# Patient Record
Sex: Male | Born: 1950 | Race: Asian | Hispanic: No | Marital: Married | Smoking: Never smoker
Health system: Southern US, Community
[De-identification: ages and names within clinical notes are randomized; demographics above are authoritative.]

## PROBLEM LIST (undated history)

## (undated) ENCOUNTER — Ambulatory Visit (HOSPITAL_BASED_OUTPATIENT_CLINIC_OR_DEPARTMENT_OTHER): Payer: Self-pay | Admitting: Gastroenterology

## (undated) DIAGNOSIS — G20A1 Parkinson's disease without dyskinesia, without mention of fluctuations: Secondary | ICD-10-CM

## (undated) DIAGNOSIS — G20 Parkinson's disease: Secondary | ICD-10-CM

## (undated) DIAGNOSIS — E785 Hyperlipidemia, unspecified: Secondary | ICD-10-CM

## (undated) DIAGNOSIS — E119 Type 2 diabetes mellitus without complications: Secondary | ICD-10-CM

## (undated) DIAGNOSIS — I10 Essential (primary) hypertension: Secondary | ICD-10-CM

## (undated) DIAGNOSIS — Z0000 Encounter for general adult medical examination without abnormal findings: Secondary | ICD-10-CM

## (undated) DIAGNOSIS — Z86010 Personal history of colonic polyps: Secondary | ICD-10-CM

## (undated) DIAGNOSIS — M545 Low back pain: Secondary | ICD-10-CM

## (undated) DIAGNOSIS — I639 Cerebral infarction, unspecified: Secondary | ICD-10-CM

## (undated) DIAGNOSIS — R251 Tremor, unspecified: Secondary | ICD-10-CM

## (undated) HISTORY — DX: Essential (primary) hypertension: I10

## (undated) HISTORY — PX: COLONOSCOPY: SHX174

## (undated) HISTORY — DX: Hyperlipidemia, unspecified: E78.5

## (undated) HISTORY — DX: Low back pain: M54.5

## (undated) HISTORY — DX: Tremor, unspecified: R25.1

## (undated) HISTORY — DX: Type 2 diabetes mellitus without complications: E11.9

## (undated) HISTORY — DX: Cerebral infarction, unspecified: I63.9

## (undated) HISTORY — DX: Personal history of colonic polyps: Z86.010

## (undated) HISTORY — DX: Encounter for general adult medical examination without abnormal findings: Z00.00

## (undated) HISTORY — DX: Parkinson's disease: G20

---

## 1978-04-18 HISTORY — PX: VASECTOMY: SHX75

## 1996-04-18 DIAGNOSIS — I639 Cerebral infarction, unspecified: Secondary | ICD-10-CM

## 1996-04-18 HISTORY — DX: Cerebral infarction, unspecified: I63.9

## 2004-06-22 ENCOUNTER — Ambulatory Visit: Admission: RE | Admit: 2004-06-22 | Payer: Self-pay | Source: Ambulatory Visit | Admitting: Gastroenterology

## 2005-02-14 ENCOUNTER — Ambulatory Visit: Payer: Self-pay | Admitting: Internal Medicine

## 2005-03-15 ENCOUNTER — Ambulatory Visit: Payer: Self-pay | Admitting: Internal Medicine

## 2005-06-28 ENCOUNTER — Ambulatory Visit: Payer: Self-pay | Admitting: Internal Medicine

## 2005-07-05 ENCOUNTER — Ambulatory Visit: Payer: Self-pay | Admitting: Internal Medicine

## 2005-09-13 ENCOUNTER — Ambulatory Visit: Payer: Self-pay | Admitting: Internal Medicine

## 2005-10-13 ENCOUNTER — Ambulatory Visit: Payer: Self-pay | Admitting: Internal Medicine

## 2006-03-02 ENCOUNTER — Ambulatory Visit: Payer: Self-pay | Admitting: Internal Medicine

## 2006-07-13 ENCOUNTER — Ambulatory Visit: Payer: Self-pay | Admitting: Internal Medicine

## 2006-07-13 LAB — CONVERTED CEMR LAB
Cholesterol: 175 mg/dL (ref 0–200)
Direct LDL: 96.9 mg/dL
GFR calc Af Amer: 113 mL/min
GFR calc non Af Amer: 93 mL/min
Glucose, Bld: 160 mg/dL — ABNORMAL HIGH (ref 70–99)
Sodium: 140 meq/L (ref 135–145)
Total CHOL/HDL Ratio: 4.1
Triglycerides: 238 mg/dL (ref 0–149)
VLDL: 48 mg/dL — ABNORMAL HIGH (ref 0–40)

## 2006-07-27 ENCOUNTER — Ambulatory Visit: Payer: Self-pay | Admitting: Internal Medicine

## 2006-11-24 ENCOUNTER — Ambulatory Visit: Payer: Self-pay | Admitting: Internal Medicine

## 2006-11-24 LAB — CONVERTED CEMR LAB
ALT: 43 units/L (ref 0–53)
Bilirubin, Direct: 0.1 mg/dL (ref 0.0–0.3)
CO2: 28 meq/L (ref 19–32)
Calcium: 9.3 mg/dL (ref 8.4–10.5)
Cholesterol: 163 mg/dL (ref 0–200)
GFR calc Af Amer: 100 mL/min
Glucose, Bld: 140 mg/dL — ABNORMAL HIGH (ref 70–99)
HDL: 44.4 mg/dL (ref >39.0)
Potassium: 4.4 meq/L (ref 3.5–5.1)
Sodium: 133 meq/L — ABNORMAL LOW (ref 135–145)
Total CHOL/HDL Ratio: 3.7
Total Protein: 7.1 g/dL (ref 6.0–8.3)
Triglycerides: 79 mg/dL (ref 0–149)

## 2007-01-05 ENCOUNTER — Ambulatory Visit: Payer: Self-pay | Admitting: Internal Medicine

## 2007-02-09 ENCOUNTER — Ambulatory Visit: Payer: Self-pay | Admitting: Internal Medicine

## 2007-04-10 DIAGNOSIS — I6350 Cerebral infarction due to unspecified occlusion or stenosis of unspecified cerebral artery: Secondary | ICD-10-CM | POA: Insufficient documentation

## 2007-04-10 DIAGNOSIS — E1165 Type 2 diabetes mellitus with hyperglycemia: Secondary | ICD-10-CM

## 2007-04-10 DIAGNOSIS — I10 Essential (primary) hypertension: Secondary | ICD-10-CM

## 2007-06-07 ENCOUNTER — Ambulatory Visit: Payer: Self-pay | Admitting: Internal Medicine

## 2007-06-07 DIAGNOSIS — R740 Nonspecific elevation of levels of transaminase and lactic acid dehydrogenase [LDH]: Secondary | ICD-10-CM

## 2007-06-07 LAB — CONVERTED CEMR LAB
AST: 29 units/L (ref 0–37)
HDL: 40.1 mg/dL (ref >39.0)
PSA: 0.43 ng/mL (ref 0.10–4.00)
VLDL: 20 mg/dL (ref 0–40)

## 2007-06-12 ENCOUNTER — Encounter: Payer: Self-pay | Admitting: Internal Medicine

## 2007-07-03 ENCOUNTER — Ambulatory Visit: Payer: Self-pay | Admitting: Internal Medicine

## 2007-07-03 DIAGNOSIS — I8390 Asymptomatic varicose veins of unspecified lower extremity: Secondary | ICD-10-CM | POA: Insufficient documentation

## 2007-07-03 DIAGNOSIS — E785 Hyperlipidemia, unspecified: Secondary | ICD-10-CM

## 2007-07-04 ENCOUNTER — Encounter (INDEPENDENT_AMBULATORY_CARE_PROVIDER_SITE_OTHER): Payer: Self-pay | Admitting: *Deleted

## 2007-07-20 ENCOUNTER — Encounter: Payer: Self-pay | Admitting: Internal Medicine

## 2007-08-08 ENCOUNTER — Ambulatory Visit: Payer: Self-pay | Admitting: Vascular Surgery

## 2007-11-02 ENCOUNTER — Ambulatory Visit: Payer: Self-pay | Admitting: Vascular Surgery

## 2007-11-22 ENCOUNTER — Ambulatory Visit: Payer: Self-pay | Admitting: Internal Medicine

## 2007-11-22 LAB — CONVERTED CEMR LAB
ALT: 27 units/L (ref 0–53)
CO2: 31 meq/L (ref 19–32)
Chloride: 102 meq/L (ref 96–112)
Creatinine, Ser: 1 mg/dL (ref 0.4–1.5)
Creatinine,U: 147.8 mg/dL
Hgb A1c MFr Bld: 7.1 % — ABNORMAL HIGH (ref 4.6–6.0)
Microalb Creat Ratio: 3.4 mg/g (ref 0.0–30.0)
Microalb, Ur: 0.5 mg/dL (ref 0.0–1.9)
Sodium: 139 meq/L (ref 135–145)
Total CHOL/HDL Ratio: 4
Triglycerides: 101 mg/dL (ref 0–149)

## 2007-11-29 ENCOUNTER — Ambulatory Visit: Payer: Self-pay | Admitting: Internal Medicine

## 2008-01-02 ENCOUNTER — Ambulatory Visit: Payer: Self-pay | Admitting: Vascular Surgery

## 2008-01-09 ENCOUNTER — Ambulatory Visit: Payer: Self-pay | Admitting: Vascular Surgery

## 2008-02-22 ENCOUNTER — Ambulatory Visit: Payer: Self-pay | Admitting: Vascular Surgery

## 2008-06-04 ENCOUNTER — Ambulatory Visit: Payer: Self-pay | Admitting: Internal Medicine

## 2008-06-04 LAB — CONVERTED CEMR LAB
CO2: 29 meq/L (ref 19–32)
Chloride: 99 meq/L (ref 96–112)
Creatinine,U: 53.9 mg/dL
Glucose, Bld: 132 mg/dL — ABNORMAL HIGH (ref 70–99)
Hgb A1c MFr Bld: 9 % — ABNORMAL HIGH (ref 4.6–6.0)
Microalb Creat Ratio: 3.7 mg/g (ref 0.0–30.0)
Microalb, Ur: 0.2 mg/dL (ref 0.0–1.9)
Potassium: 4.1 meq/L (ref 3.5–5.1)
Sodium: 137 meq/L (ref 135–145)

## 2008-06-16 ENCOUNTER — Ambulatory Visit: Payer: Self-pay | Admitting: Internal Medicine

## 2008-07-16 ENCOUNTER — Telehealth: Payer: Self-pay | Admitting: Internal Medicine

## 2008-09-05 ENCOUNTER — Ambulatory Visit: Payer: Self-pay | Admitting: Internal Medicine

## 2008-09-05 LAB — CONVERTED CEMR LAB: Hgb A1c MFr Bld: 7 % — ABNORMAL HIGH (ref 4.6–6.5)

## 2008-09-16 ENCOUNTER — Ambulatory Visit: Payer: Self-pay | Admitting: Internal Medicine

## 2009-01-08 ENCOUNTER — Ambulatory Visit: Payer: Self-pay | Admitting: Internal Medicine

## 2009-01-08 LAB — CONVERTED CEMR LAB
AST: 29 units/L (ref 0–37)
BUN: 23 mg/dL (ref 6–23)
Bilirubin, Direct: 0.2 mg/dL (ref 0.0–0.3)
CO2: 23 meq/L (ref 19–32)
Calcium: 9.5 mg/dL (ref 8.4–10.5)
Creatinine, Ser: 1.12 mg/dL (ref 0.40–1.50)
Glucose, Bld: 122 mg/dL — ABNORMAL HIGH (ref 70–99)
Hgb A1c MFr Bld: 6.4 % — ABNORMAL HIGH (ref 4.6–6.1)
Indirect Bilirubin: 0.5 mg/dL (ref 0.0–0.9)
Microalb, Ur: 0.98 mg/dL (ref 0.00–1.89)
Sodium: 141 meq/L (ref 135–145)
Total Bilirubin: 0.7 mg/dL (ref 0.3–1.2)
Total CHOL/HDL Ratio: 3

## 2009-01-19 ENCOUNTER — Ambulatory Visit: Payer: Self-pay | Admitting: Internal Medicine

## 2009-05-01 ENCOUNTER — Ambulatory Visit: Payer: Self-pay | Admitting: Internal Medicine

## 2009-05-01 LAB — CONVERTED CEMR LAB
CO2: 25 meq/L (ref 19–32)
Chloride: 101 meq/L (ref 96–112)
Creatinine, Ser: 1.04 mg/dL (ref 0.40–1.50)
Glucose, Bld: 102 mg/dL — ABNORMAL HIGH (ref 70–99)
Sodium: 139 meq/L (ref 135–145)

## 2009-05-11 ENCOUNTER — Ambulatory Visit: Payer: Self-pay | Admitting: Internal Medicine

## 2009-08-20 ENCOUNTER — Telehealth: Payer: Self-pay | Admitting: Internal Medicine

## 2009-10-15 ENCOUNTER — Encounter: Payer: Self-pay | Admitting: Internal Medicine

## 2009-10-15 LAB — CONVERTED CEMR LAB
ALT: 23 units/L (ref 0–53)
AST: 26 units/L (ref 0–37)
Albumin: 4.4 g/dL (ref 3.5–5.2)
Alkaline Phosphatase: 47 units/L (ref 39–117)
BUN: 19 mg/dL (ref 6–23)
Bilirubin, Direct: 0.1 mg/dL (ref 0.0–0.3)
CO2: 25 meq/L (ref 19–32)
Calcium: 9.5 mg/dL (ref 8.4–10.5)
Chloride: 104 meq/L (ref 96–112)
Cholesterol: 156 mg/dL (ref 0–200)
Creatinine, Ser: 0.94 mg/dL (ref 0.40–1.50)
Creatinine, Urine: 106.1 mg/dL
Glucose, Bld: 138 mg/dL — ABNORMAL HIGH (ref 70–99)
HDL: 42 mg/dL (ref >39)
Hgb A1c MFr Bld: 6.7 % — ABNORMAL HIGH (ref <5.7)
Indirect Bilirubin: 0.7 mg/dL (ref 0.0–0.9)
LDL Cholesterol: 85 mg/dL (ref 0–99)
Microalb Creat Ratio: 4.7 mg/g (ref 0.0–30.0)
Microalb, Ur: 0.5 mg/dL (ref 0.00–1.89)
Potassium: 4 meq/L (ref 3.5–5.3)
Sodium: 141 meq/L (ref 135–145)
Total Bilirubin: 0.8 mg/dL (ref 0.3–1.2)
Total CHOL/HDL Ratio: 3.7
Total Protein: 6.8 g/dL (ref 6.0–8.3)
Triglycerides: 147 mg/dL (ref <150)
VLDL: 29 mg/dL (ref 0–40)

## 2009-10-29 ENCOUNTER — Ambulatory Visit: Payer: Self-pay | Admitting: Internal Medicine

## 2009-11-18 ENCOUNTER — Telehealth: Payer: Self-pay | Admitting: Internal Medicine

## 2009-11-19 ENCOUNTER — Encounter: Payer: Self-pay | Admitting: Internal Medicine

## 2010-01-21 ENCOUNTER — Ambulatory Visit: Payer: Self-pay | Admitting: Internal Medicine

## 2010-04-21 ENCOUNTER — Encounter: Payer: Self-pay | Admitting: Internal Medicine

## 2010-04-21 LAB — CONVERTED CEMR LAB
BUN: 12 mg/dL
CO2: 27 meq/L
Calcium: 9.5 mg/dL
Chloride: 105 meq/L
Creatinine, Ser: 0.91 mg/dL
Glucose, Bld: 142 mg/dL — ABNORMAL HIGH
Hgb A1c MFr Bld: 7.7 % — ABNORMAL HIGH
Potassium: 4.4 meq/L
Sodium: 142 meq/L

## 2010-04-29 ENCOUNTER — Ambulatory Visit
Admission: RE | Admit: 2010-04-29 | Discharge: 2010-04-29 | Payer: Self-pay | Source: Home / Self Care | Attending: Internal Medicine | Admitting: Internal Medicine

## 2010-04-29 LAB — HM DIABETES FOOT EXAM

## 2010-05-18 NOTE — Miscellaneous (Signed)
Summary: Lab Orders  Clinical Lists Changes  Orders: Added new Test order of T-Basic Metabolic Panel [phone]) - Signed Added new Test order of T-Hepatic Function [phone]) - Signed Added new Test order of T-Lipid Profile [phone]) - Signed Added new Test order of T- Hemoglobin A1C ([id number]) - Signed Added new Test order of T-Urine Microalbumin w/creat. ratio [id number]) - Signed

## 2010-05-18 NOTE — Progress Notes (Signed)
  Phone Note Outgoing Call   Summary of Call: call pt - if > 1 yr since last DM eye exam,  needs referral. see order Initial call taken by: D. Thomos Lemons DO,  Aug 20, 2009 6:07 PM  Follow-up for Phone Call        Pt had last eye exam  @  [facility]   Dr Davonna Belling   June 29, 2009 Follow-up by: Darral Dash,  Aug 25, 2009 2:24 PM

## 2010-05-18 NOTE — Assessment & Plan Note (Signed)
Summary: 4 MONTH FOLLOW UP/MHF   Vital Signs:  Patient profile:   60 year old male Weight:      176.25 pounds BMI:     25.38 O2 Sat:      99 % on Room air Temp:     97.6 degrees F oral Pulse rate:   83 / minute Pulse rhythm:   regular Resp:     16 per minute BP sitting:   126 / 90  (left arm) Cuff size:   regular  Vitals Entered By: Glendell Docker CMA (May 11, 2009 3:12 PM)  O2 Flow:  Room air  Primary Care Provider:  D. Thomos Lemons DO  CC:  Follow up disease management and Type 2 diabetes mellitus follow-up.  History of Present Illness: Follow up disease management  Type 2 Diabetes Mellitus Follow-Up      This is a 59 year old man who presents for Type 2 diabetes mellitus follow-up.  The patient denies self managed hypoglycemia, hypoglycemia requiring help, and weight loss.  The patient denies the following symptoms: chest pain.  Since the last visit the patient reports good dietary compliance, compliance with medications, exercising regularly, and monitoring blood glucose.    Htn - stable   Preventive Screening-Counseling & Management  Alcohol-Tobacco     Smoking Status: quit  Allergies (verified): No Known Drug Allergies  Past History:  Past Medical History: DM  Type II Hyperlipidemia  Hypertension       Family History: Father recently died at age 36 - GI cancer  Brother recently died at age 52 - lung cancer    Social History: Married Alcohol use-no  Tobacco use -no    Smoking Status:  quit  Physical Exam  General:  alert, well-developed, and well-nourished.   Neck:  supple and no masses.   Lungs:  normal respiratory effort and normal breath sounds.   Heart:  normal rate, regular rhythm, and no gallop.    Diabetes Management Exam:    Foot Exam (with socks and/or shoes not present):       Inspection:          Left foot: normal          Right foot: normal   Impression & Recommendations:  Problem # 1:  DIABETES MELLITUS, TYPE II  (ICD-250.00) stable.  Maintain current medication regimen.  His updated medication list for this problem includes:    Lisinopril-hydrochlorothiazide 20-12.5 Mg Tabs (Lisinopril-hydrochlorothiazide) .Marland Kitchen... Take 1  tablet by mouth once a day    Metformin Hcl 1000 Mg Tabs (Metformin hcl) .Marland Kitchen... Take 1 tablet by mouth two times a day    Low-dose Aspirin 81 Mg Tabs (Aspirin) .Marland Kitchen... Take 1 tablet by mouth once a day    Actos 45 Mg Tabs (Pioglitazone hcl) .Marland Kitchen... 1/2 tablet by mouth once daily    Lantus Solostar 100 Unit/ml Soln (Insulin glargine) .Marland KitchenMarland KitchenMarland KitchenMarland Kitchen 10-20 units once in am  Labs Reviewed: Creat: 1.04 (05/01/2009)     Last Eye Exam: normal (01/28/2008) Reviewed HgBA1c results: 7.1 (05/01/2009)  6.4 (01/08/2009)  Problem # 2:  HYPERTENSION (ICD-401.9) stable.  Maintain current medication regimen.  His updated medication list for this problem includes:    Lisinopril-hydrochlorothiazide 20-12.5 Mg Tabs (Lisinopril-hydrochlorothiazide) .Marland Kitchen... Take 1  tablet by mouth once a day  BP today: 126/90 Prior BP: 110/80 (01/19/2009)  Labs Reviewed: K+: 4.3 (05/01/2009) Creat: : 1.04 (05/01/2009)   Chol: 157 (01/08/2009)   HDL: 52 (01/08/2009)   LDL: 86 (01/08/2009)   TG: 94 (  01/08/2009)  Complete Medication List: 1)  Lisinopril-hydrochlorothiazide 20-12.5 Mg Tabs (Lisinopril-hydrochlorothiazide) .... Take 1  tablet by mouth once a day 2)  Metformin Hcl 1000 Mg Tabs (Metformin hcl) .... Take 1 tablet by mouth two times a day 3)  Simvastatin 80 Mg Tabs (Simvastatin) .... 1/2 tablet by mouth at bedtime 4)  Onetouch Ultra Test Strp (Glucose blood) .... Test blood sugar two times a day 5)  Low-dose Aspirin 81 Mg Tabs (Aspirin) .... Take 1 tablet by mouth once a day 6)  Oscal 500/200 D-3 500-200 Mg-unit Tabs (Calcium-vitamin d) .... Take 1 tablet by mouth once a day 7)  Fish Oil 1000 Mg Caps (Omega-3 fatty acids) .... Take 1 tablet by mouth two times a day 8)  Multivitamins Tabs (Multiple vitamin) .... Take  1 tablet by mouth once a day 9)  Actos 45 Mg Tabs (Pioglitazone hcl) .... 1/2 tablet by mouth once daily 10)  Lantus Solostar 100 Unit/ml Soln (Insulin glargine) .Marland Kitchen.. 10-20 units once in am 11)  Pen Needles 31g X 8 Mm Misc (Insulin pen needle) .... For once daily use 12)  Triamcinolone Acetonide 0.1 % Crea (Triamcinolone acetonide) .... Use as directed  Patient Instructions: 1)  Please schedule a follow-up appointment in 6 months. 2)  BMP prior to visit, ICD-9:  401.9 3)  Hepatic Panel prior to visit, ICD-9: 272.4 4)  Lipid Panel prior to visit, ICD-9: 272.4 5)  HbgA1C prior to visit, ICD-9: 250.00 6)  Urine Microalbumin prior to visit, ICD-9: 250.00 7)  Please return for lab work one (1) week before your next appointment.       Current Allergies (reviewed today): No known allergies    Prevention & Chronic Care Immunizations   Influenza vaccine: Fluvax Non-MCR  (01/19/2009)    Tetanus booster: Not documented    Pneumococcal vaccine: Pneumovax  (11/29/2007)  Colorectal Screening   Hemoccult: Not documented    Colonoscopy: Not documented  Other Screening   PSA: 0.43  (06/07/2007)   Smoking status: quit  (05/11/2009)  Diabetes Mellitus   HgbA1C: 7.1  (05/01/2009)    Eye exam: normal  (01/28/2008)    Foot exam: yes  (05/11/2009)   High risk foot: Not documented   Foot care education: Not documented    Urine microalbumin/creatinine ratio: 5.0  (01/08/2009)  Lipids   Total Cholesterol: 157  (01/08/2009)   LDL: 86  (01/08/2009)   LDL Direct: 96.9  (07/13/2006)   HDL: 52  (01/08/2009)   Triglycerides: 94  (01/08/2009)    SGOT (AST): 29  (01/08/2009)   SGPT (ALT): 25  (01/08/2009)   Alkaline phosphatase: 55  (01/08/2009)   Total bilirubin: 0.7  (01/08/2009)  Hypertension   Last Blood Pressure: 126 / 90  (05/11/2009)   Serum creatinine: 1.04  (05/01/2009)   Serum potassium 4.3  (05/01/2009)  Self-Management Support :    Diabetes self-management support:  Not documented    Hypertension self-management support: Not documented    Lipid self-management support: Not documented

## 2010-05-18 NOTE — Miscellaneous (Signed)
Summary: Lab orders for January appt  Clinical Lists Changes  Orders: Added new Test order of T-Basic Metabolic Panel [phone]) - Signed Added new Test order of T- Hemoglobin A1C ([id number]) - Signed

## 2010-05-18 NOTE — Assessment & Plan Note (Signed)
Summary: FLU SHOT / TF,CMA  Nurse Visit   Allergies: No Known Drug Allergies  Immunizations Administered:  Influenza Vaccine # 1:    Vaccine Type: Fluvax 3+    Site: left deltoid    Mfr: Gl[employer]oSmith[employer]    Dose: 0.5 ml    Route: IM    Given by: Glendell Docker CMA    Exp. Date: 10/16/2010    Lot #: [id number]    VIS given: 11/10/09 version given January 21, 2010.  Flu Vaccine Consent Questions:    Do you have a history of severe allergic reactions to this vaccine? no    Any prior history of allergic reactions to egg and/or gelatin? no    Do you have a sensitivity to the preservative Thimersol? no    Do you have a past history of Guillan-Barre Syndrome? no    Do you currently have an acute febrile illness? no    Have you ever had a severe reaction to latex? no    Vaccine information given and explained to patient? yes  Orders Added: 1)  Flu Vaccine 15yrs + [90658] 2)  Admin 1st Vaccine [09811]

## 2010-05-18 NOTE — Assessment & Plan Note (Signed)
Summary: 6 month follow up/mhf rsc with pt from bump/mhf   Vital Signs:  Patient profile:   60 year old male Weight:      166.75 pounds BMI:     24.01 O2 Sat:      100 % on Room air Temp:     97.8 degrees F oral Pulse rate:   76 / minute Pulse rhythm:   regular Resp:     18 per minute BP sitting:   124 / 80  (right arm) Cuff size:   regular  Vitals Entered By: Glendell Docker CMA (October 29, 2009 3:24 PM)  O2 Flow:  Room air CC: 6 month Follow up disease management, Type 2 diabetes mellitus follow-up Is Patient Diabetic? Yes Did you bring your meter with you today? Yes Pain Assessment Patient in pain? no       Does patient need assistance? Functional Status Self care Ambulation Normal Comments low blood sugar 119 high 135 avg 125, stopped taking the Actos 2 months ago  due to increase in copay   Primary Care Provider:  D. Thomos Lemons DO  CC:  6 month Follow up disease management and Type 2 diabetes mellitus follow-up.  History of Present Illness:  Type 2 Diabetes Mellitus Follow-Up      This is a 60 year old man who presents for Type 2 diabetes mellitus follow-up.  The patient denies self managed hypoglycemia, hypoglycemia requiring help, and weight gain.  The patient denies the following symptoms: chest pain.  Since the last visit the patient reports good dietary compliance, exercising regularly, and monitoring blood glucose.  he stopped actos due to cost  Allergies (verified): No Known Drug Allergies  Past History:  Past Medical History: DM  Type II Hyperlipidemia   Hypertension       Family History: Father recently died at age 29 - GI cancer  Brother recently died at age 17 - lung cancer     Social History: Married works at [employer] Alcohol use-no   Tobacco use -no      Physical Exam  General:  alert, well-developed, and well-nourished.   Neck:  supple and no masses.  no carotid bruits.   Lungs:  normal respiratory effort and normal breath  sounds.   Heart:  normal rate, regular rhythm, and no gallop.   Extremities:  No lower extremity edema   Diabetes Management Exam:    Foot Exam (with socks and/or shoes not present):       Inspection:          Left foot: normal          Right foot: normal   Impression & Recommendations:  Problem # 1:  DIABETES MELLITUS, TYPE II (ICD-250.00)  He stopped actos due to cost.  No sign change in diabetes control.  The following medications were removed from the medication list:    Actos 45 Mg Tabs (Pioglitazone hcl) .Marland Kitchen... 1/2 tablet by mouth once daily His updated medication list for this problem includes:    Lisinopril-hydrochlorothiazide 20-12.5 Mg Tabs (Lisinopril-hydrochlorothiazide) .Marland Kitchen... Take 1  tablet by mouth once a day    Metformin Hcl 1000 Mg Tabs (Metformin hcl) .Marland Kitchen... Take 1 tablet by mouth two times a day    Low-dose Aspirin 81 Mg Tabs (Aspirin) .Marland Kitchen... Take 1 tablet by mouth once a day    Lantus Solostar 100 Unit/ml Soln (Insulin glargine) .Marland KitchenMarland KitchenMarland KitchenMarland Kitchen 10-20 units once in am  Labs Reviewed: Creat: 0.94 (10/15/2009)     Last  Eye Exam: normal (01/28/2008) Reviewed HgBA1c results: 6.7 (10/15/2009)  7.1 (05/01/2009)  Problem # 2:  HYPERTENSION (ICD-401.9) Assessment: Unchanged  His updated medication list for this problem includes:    Lisinopril-hydrochlorothiazide 20-12.5 Mg Tabs (Lisinopril-hydrochlorothiazide) .Marland Kitchen... Take 1  tablet by mouth once a day  BP today: 124/80 Prior BP: 126/90 (05/11/2009)  Labs Reviewed: K+: 4.0 (10/15/2009) Creat: : 0.94 (10/15/2009)   Chol: 156 (10/15/2009)   HDL: 42 (10/15/2009)   LDL: 85 (10/15/2009)   TG: 147 (10/15/2009)  Complete Medication List: 1)  Lisinopril-hydrochlorothiazide 20-12.5 Mg Tabs (Lisinopril-hydrochlorothiazide) .... Take 1  tablet by mouth once a day 2)  Metformin Hcl 1000 Mg Tabs (Metformin hcl) .... Take 1 tablet by mouth two times a day 3)  Simvastatin 80 Mg Tabs (Simvastatin) .... 1/2 tablet by mouth at bedtime 4)   Accu-chek Aviva Strp (Glucose blood) .... Use daily as directed 5)  Low-dose Aspirin 81 Mg Tabs (Aspirin) .... Take 1 tablet by mouth once a day 6)  Oscal 500/200 D-3 500-200 Mg-unit Tabs (Calcium-vitamin d) .... Take 1 tablet by mouth once a day 7)  Fish Oil 1000 Mg Caps (Omega-3 fatty acids) .... Take 1 tablet by mouth two times a day 8)  Multivitamins Tabs (Multiple vitamin) .... Take 1 tablet by mouth once a day 9)  Lantus Solostar 100 Unit/ml Soln (Insulin glargine) .Marland Kitchen.. 10-20 units once in am 10)  Pen Needles 31g X 8 Mm Misc (Insulin pen needle) .... For once daily use 11)  Triamcinolone Acetonide 0.1 % Crea (Triamcinolone acetonide) .... Use as directed  Patient Instructions: 1)  Please schedule a follow-up appointment in 6 months. 2)  BMP prior to visit, ICD-9: 401.9 3)  HbgA1C prior to visit, ICD-9: 250.00 4)  Please return for lab work one (1) week before your next appointment.  Prescriptions: ACCU-CHEK AVIVA  STRP (GLUCOSE BLOOD) use daily as directed  #100 x 3   Entered and Authorized by:   D. Thomos Lemons DO   Signed by:   D. Thomos Lemons DO on 10/29/2009   Method used:   Print then Give to Patient   RxID:   [id number]   Current Allergies (reviewed today): No known allergies

## 2010-05-18 NOTE — Progress Notes (Signed)
Summary: Colonoscopy  ---- Converted from flag ---- ---- 11/12/2009 5:18 PM, D. Thomos Lemons DO wrote: call pt - get copy of colonoscopy that was completed in [place] ------------------------------  Phone Note Outgoing Call   Call placed by: Glendell Docker CMA,  November 18, 2009 8:34 AM Call placed to: Patient Summary of Call: Call placed to patient at [phone], no answer. A voice message was left for patient to return call. Initial call taken by: Glendell Docker CMA,  November 18, 2009 8:34 AM  Follow-up for Phone Call        Patient returned phone call, and patient was  informed per Dr Artist Pais instructions. He states that he will look for his copy and if he is able to find it he will it in when his wife has her appointment with Dr Artist Pais on th 18th of August  Follow-up by: Glendell Docker CMA,  November 19, 2009 8:42 AM

## 2010-05-20 NOTE — Miscellaneous (Signed)
Summary: Orders Update  Clinical Lists Changes  Orders: Added new Test order of T-Basic Metabolic Panel ([id number]) - Signed Added new Test order of T- Hemoglobin A1C ([id number]) - Signed 

## 2010-05-20 NOTE — Assessment & Plan Note (Signed)
Summary: 6 month follow up/mhf   Vital Signs:  Patient profile:   60 year old male Height:      70 inches Weight:      173 pounds BMI:     24.91 O2 Sat:      98 % on Room air Temp:     97.9 degrees F oral Pulse rate:   75 / minute Resp:     18 per minute BP sitting:   118 / 80  (right arm) Cuff size:   large  Vitals Entered By: Glendell Docker CMA (April 29, 2010 2:56 PM)  O2 Flow:  Room air CC: 6 month follow up, Type 2 diabetes mellitus follow-up Is Patient Diabetic? Yes Did you bring your meter with you today? No Pain Assessment Patient in pain? no      Comments no cocerns,, checks blood suger weekly   Primary Care Provider:  Dondra Spry DO  CC:  6 month follow up and Type 2 diabetes mellitus follow-up.  History of Present Illness:  Type 2 Diabetes Mellitus Follow-Up      This is a 60 year old man who presents for Type 2 diabetes mellitus follow-up.  The patient denies polyuria, polydipsia, and hypoglycemia requiring help.  The patient denies the following symptoms: chest pain.  Since the last visit the patient reports poor dietary compliance, compliance with medications, not exercising regularly, and monitoring blood glucose.    visited daughter in [place] over the holidays   Preventive Screening-Counseling & Management  Alcohol-Tobacco     Smoking Status: quit  Allergies (verified): No Known Drug Allergies  Past History:  Past Medical History: DM  Type II Hyperlipidemia    Hypertension       Family History: Father recently died at age 18 - GI cancer  Brother recently died at age 16 - lung cancer      Social History: Married works at [employer] Alcohol use-no   Tobacco use -no       Physical Exam  General:  alert, well-developed, and well-nourished.   Neck:  supple and no masses.  no carotid bruits.   Lungs:  normal respiratory effort and normal breath sounds.   Heart:  normal rate, regular rhythm, and no gallop.   Extremities:  No  lower extremity edema   Diabetes Management Exam:    Foot Exam (with socks and/or shoes not present):       Inspection:          Left foot: normal          Right foot: normal   Impression & Recommendations:  Problem # 1:  DIABETES MELLITUS, TYPE II (ICD-250.00) Assessment Deteriorated less exercise and diet worse over holidays.  he defers start of short acting insulin Maintain current medication regimen.  His updated medication list for this problem includes:    Lisinopril-hydrochlorothiazide 20-12.5 Mg Tabs (Lisinopril-hydrochlorothiazide) .Marland Kitchen... Take 1  tablet by mouth once a day    Metformin Hcl 1000 Mg Tabs (Metformin hcl) .Marland Kitchen... Take 1 tablet by mouth two times a day    Low-dose Aspirin 81 Mg Tabs (Aspirin) .Marland Kitchen... Take 1 tablet by mouth once a day    Lantus Solostar 100 Unit/ml Soln (Insulin glargine) .Marland KitchenMarland KitchenMarland KitchenMarland Kitchen 30 units once in am  Labs Reviewed: Creat: 0.91 (04/21/2010)     Last Eye Exam: normal (01/28/2008) Reviewed HgBA1c results: 7.7 (04/21/2010)  6.7 (10/15/2009)  Problem # 2:  HYPERTENSION (ICD-401.9)  His updated medication list for this problem includes:  Lisinopril-hydrochlorothiazide 20-12.5 Mg Tabs (Lisinopril-hydrochlorothiazide) .Marland Kitchen... Take 1  tablet by mouth once a day  BP today: 118/80 Prior BP: 124/80 (10/29/2009)  Labs Reviewed: K+: 4.4 (04/21/2010) Creat: : 0.91 (04/21/2010)   Chol: 156 (10/15/2009)   HDL: 42 (10/15/2009)   LDL: 85 (10/15/2009)   TG: 147 (10/15/2009)  Complete Medication List: 1)  Lisinopril-hydrochlorothiazide 20-12.5 Mg Tabs (Lisinopril-hydrochlorothiazide) .... Take 1  tablet by mouth once a day 2)  Metformin Hcl 1000 Mg Tabs (Metformin hcl) .... Take 1 tablet by mouth two times a day 3)  Simvastatin 80 Mg Tabs (Simvastatin) .... 1/2 tablet by mouth at bedtime 4)  Accu-chek Aviva Strp (Glucose blood) .... Use daily as directed 5)  Low-dose Aspirin 81 Mg Tabs (Aspirin) .... Take 1 tablet by mouth once a day 6)  Oscal 500/200 D-3  500-200 Mg-unit Tabs (Calcium-vitamin d) .... Take 1 tablet by mouth once a day 7)  Fish Oil 1000 Mg Caps (Omega-3 fatty acids) .... Take 1 tablet by mouth two times a day 8)  Multivitamins Tabs (Multiple vitamin) .... Take 1 tablet by mouth once a day 9)  Lantus Solostar 100 Unit/ml Soln (Insulin glargine) .... 30 units once in am 10)  Pen Needles 31g X 8 Mm Misc (Insulin pen needle) .... For once daily use 11)  Triamcinolone Acetonide 0.1 % Crea (Triamcinolone acetonide) .... Use as directed  Patient Instructions: 1)  Please schedule a follow-up appointment in 6 months. 2)  BMP prior to visit, ICD-9: 401.9 3)  Hepatic Panel prior to visit, ICD-9: 272.4 4)  Lipid Panel prior to visit, ICD-9: 272.4 5)  HbgA1C prior to visit, ICD-9:  250.02 6)  Urine Microalbumin prior to visit, ICD-9:25.02 7)  Please return for lab work one (1) week before your next appointment.  Prescriptions: LANTUS SOLOSTAR 100 UNIT/ML SOLN (INSULIN GLARGINE) 30 units once in AM  #3 month x 3   Entered and Authorized by:   D. Thomos Lemons DO   Signed by:   D. Thomos Lemons DO on 04/29/2010   Method used:   Electronically to        [facility] [address] [place] (retail)       [phone]       [place], [place]  [address]       Ph: [phone]       Fax: [phone]   RxID:   [id number] PEN NEEDLES 31G X 8 MM MISC (INSULIN PEN NEEDLE) for once daily use  #100 x 3   Entered by:   Glendell Docker CMA   Authorized by:   D. Thomos Lemons DO   Signed by:   D. Thomos Lemons DO on 04/29/2010   Method used:   Electronically to        [facility] [address] [place] (retail)       [phone]       [place], [place]  [address]       Ph: [phone]       Fax: [phone]   RxID:   [id number] SIMVASTATIN 80 MG  TABS (SIMVASTATIN) 1/2 tablet by mouth at bedtime  #90 x 3   Entered by:   Glendell Docker CMA   Authorized by:   D. Thomos Lemons DO   Signed by:   D. Thomos Lemons DO on 04/29/2010   Method used:   Electronically to  [facility] [address] (retail)       [phone]       [place], [place]  [address]       Ph: [phone]       Fax: [phone]   RxID:   [id number] METFORMIN HCL 1000 MG TABS (METFORMIN HCL) Take 1 tablet by mouth two times a day  #180 Each x 4   Entered by:   Glendell Docker CMA   Authorized by:   D. Thomos Lemons DO   Signed by:   D. Thomos Lemons DO on 04/29/2010   Method used:   Electronically to        [facility] [address] [place] (retail)       [phone]       [place], [place]  [address]       Ph: [phone]       Fax: [phone]   RxID:   [id number] LISINOPRIL-HYDROCHLOROTHIAZIDE 20-12.5 MG TABS (LISINOPRIL-HYDROCHLOROTHIAZIDE) Take 1  tablet by mouth once a day  #90 Each x 0   Entered by:   Glendell Docker CMA   Authorized by:   D. Thomos Lemons DO   Signed by:   D. Thomos Lemons DO on 04/29/2010   Method used:   Electronically to        [facility] [address] [place] (retail)       [phone]       [place], [place]  [address]       Ph: [phone]       Fax: [phone]   RxID:   [id number]    Orders Added: 1)  Est. Patient Level III [67893]     Current Allergies (reviewed today): No known allergies

## 2010-08-31 NOTE — Procedures (Signed)
DUPLEX DEEP VENOUS EXAM - LOWER EXTREMITY   INDICATION:  Followup evaluation of left leg greater saphenous vein  ablation.   HISTORY:  Edema:  Left leg.  Trauma/Surgery:  Left leg ablation of the greater saphenous vein on  01/02/2008 by Dr. Arbie Cookey.  Pain:  Left leg.  PE:  No.  Previous DVT:  No.  Anticoagulants:  No.  Other:   DUPLEX EXAM:                CFV   SFV   PopV  PTV    GSV                R  L  R  L  R  L  R   L  R  L  Thrombosis       0     0     0      0     +  Spontaneous      +     +     +      +     0  Phasic           0     0     +      +     0  Augmentation  +  +     +     +      +     0  Compressible     +     +     +      +     0  Competent        +     +     +      +     0   Legend:  + - yes  o - no  p - partial  D - decreased   IMPRESSION:  1. 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  Tributary branches of the greater      saphenous vein are thrombosed as well.  2. No thrombus is seen in the left common femoral or circumflex vein.      No evidence of left leg deep vein thrombus.  3. No significant deep vein incompetence.  4. Deep venous Doppler signals are pulsatile in the left common      femoral vein and left superficial femoral vein.    _____________________________  Larina Earthly, M.D.   MC/MEDQ  D:  01/09/2008  T:  01/09/2008  Job:  [id number]

## 2010-08-31 NOTE — Assessment & Plan Note (Signed)
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  He has no evidence of deep venous thrombosis or valvular  incompetence of the deep system.  He reports that he has continued have  discomfort despite use of graduated compression garments.  It makes it  difficult for him to fall asleep at night due to the leg pain after  prolonged standing.  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  He reports that the leg pain  makes it difficult for him to work and prolonged standing also causes  pain.  He has worn compression garments 20-30 mmHg thigh high for  greater than 3 months.  He elevates his legs when possible and also  takes non-steroidal anti-inflammatories for discomfort.  He has clearly  failed conservative therapy.  We have discussed options with the patient  to include laser ablation of his left great saphenous vein and stab  phlebectomy of multiple tributary varicosities.  He understands this and  wishes to proceed with this as soon as we can ensure insurance coverage.   Larina Earthly, M.D.  Electronically Signed   TFE/MEDQ  D:  11/02/2007  T:  11/05/2007  Job:  [id number]   cc:   Barbette Hair. Artist Pais, DO

## 2010-08-31 NOTE — Assessment & Plan Note (Signed)
OFFICE VISIT   Zachary Joyce, Zachary Joyce  DOB:  1950-12-20                                       02/22/2008  EAVWU#:[id number]   The patient presents today for final followup of his laser ablation of  the great saphenous vein with stab phlebectomy of multiple tributary  varicosities in his left leg.  He is pleased with the result.  His pain  has completely resolved.  Regarding the discomfort that he had pre-  procedurally and also soreness around the time of the procedure.  He  does have prominent puncture sites from the multiple stab phlebectomies.  I explained that this will improve in appearance over time as the mature  scar returns to a whiter color.  He also has some marking on his  proximal thigh due to the silicon in the compression garment that he  wore postprocedurally.  I explained that this should resolve with time  as well.  He is pleased with his result, as am I, and he will see [place]  again on an as needed basis.   Larina Earthly, M.D.  Electronically Signed   TFE/MEDQ  D:  02/22/2008  T:  02/25/2008  Job:  [id number]   cc:   Barbette Hair. Artist Pais, DO

## 2010-08-31 NOTE — Procedures (Signed)
LOWER EXTREMITY VENOUS REFLUX EXAM   INDICATION:  Left leg varicose veins.   EXAM:  Using color-flow imaging and pulse Doppler spectral analysis, the  left common femoral, superficial femoral, popliteal, posterior tibial,  greater and lesser saphenous veins are evaluated.  There is no evidence  suggesting deep venous insufficiency in the left lower extremity.   The left saphenofemoral junction is not competent.  The left GSV is not  competent with the caliber as described below.   The left proximal short saphenous vein demonstrates competency.   GSV Diameter (used if found to be incompetent only)                                            Right    Left  Proximal Greater Saphenous Vein           cm       0.9 cm  Proximal-to-mid-thigh                     cm       0.6 cm  Mid thigh                                 cm       0.5 cm  Mid-distal thigh                          cm       0.6 cm  Distal thigh                              cm       0.4 cm  Knee                                      cm       0.3 cm   IMPRESSION:  1. Left greater saphenous vein reflux is identified with the caliber      ranging from 0.3 cm to 0.9 cm knee to groin.  2. The left greater saphenous vein is not aneurysmal.  3. The left greater saphenous vein is not tortuous.  4. The deep venous system is competent.  5. The left lesser saphenous vein is competent.  6. There is a major tributary branch at the left greater saphenous      vein originating at the distal third of the thigh.   ___________________________________________  Larina Earthly, M.D.   MC/MEDQ  D:  11/02/2007  T:  11/02/2007  Job:  [id number]

## 2010-08-31 NOTE — Assessment & Plan Note (Signed)
OFFICE VISIT   Zachary Joyce, Zachary Joyce  DOB:  03/08/1951                                       01/09/2008  OZHYQ#:[id number]   Patient presents today for one-week followup of his left greater  saphenous vein laser ablation and multiple tributary stab phlebectomies.  He has done well since his procedure with mild discomfort.   He does on physical exam have irritation from the silicone beading at  the top of his thigh-high compression garment.  He is keeping the  silicone off this currently.  He does have mild bruising, and the  phlebectomy sites all appear to be healing quite nicely.   He underwent duplex, which reveals closure of a saphenous vein from the  knee to the saphenofemoral junction and no evidence of deep venous  thrombosis.   I am quite pleased with his initial result as is the patient.  I plan to  see him again in six weeks for final followup.   Larina Earthly, M.D.  Electronically Signed   TFE/MEDQ  D:  01/09/2008  T:  01/10/2008  Job:  [id number]

## 2010-08-31 NOTE — Consult Note (Signed)
NEW PATIENT CONSULTATION   Zachary Joyce, Zachary Joyce  DOB:  1950-10-21                                       08/08/2007  OZHYQ#:[id number]   This patient presents today for evaluation of left leg venous  varicosities.  He is a very positive healthy 60 year old gentleman with  progressive pain left medial thigh and proximal calf varicosities.  He  has had no history of deep venous thrombosis or superficial  thrombophlebitis.  He reports these been present for many years and  recently has been having increasing pain over the varicosities most  particularly the varicosities in his medial thigh.  He does have  swelling over these but does not noting any significant distal swelling.  He has not worn compression garments and has not tried nonsteroidals for  discomfort.   PAST HISTORY:  Significant for type 2 diabetes, hypertension, and  hyperlipidemia.  He has had no prior surgeries.  He has no known drug  allergies.   SOCIAL HISTORY:  Is married, does not smoke, drink alcohol.   CURRENT MEDICATIONS:  Are Actos, lisinopril, metformin, simvastatin.   PHYSICAL EXAM:  Well-developed well-nourished gentleman appearing stated  age 60 no acute distress.  His blood pressure that 151/97, pulse 71,  respirations 18.  His radial pulses are 2+.  He has 2+ dorsalis pedis  posterior tibial pulses bilaterally.  He does have marked varicosities  in the medial thigh on the left with extension down to the proximal  calf.  He underwent handheld screening duplex by me and this revealed  gross reflux in the saphenous vein throughout his upper thigh feeding  into these large plexus varicosities.   I discussed options with the patient.  I explained the treatment of this  was start him on graduated compression garments today and will plan to  see him again in 3 months to determine if this conservative treatment as  working.  He will also elevate his legs when possible will take  ibuprofen for  discomfort and we will discuss further options at his next  visit.  I did explain he would be a good candidate for laser ablation  and phlebectomy for symptom relief.  They will discuss this further on  his next visit.   Larina Earthly, M.D.  Electronically Signed   TFE/MEDQ  D:  08/08/2007  T:  08/09/2007  Job:  [id number]   cc:   Barbette Hair. Artist Pais, DO

## 2010-09-21 ENCOUNTER — Telehealth: Payer: Self-pay | Admitting: Internal Medicine

## 2010-09-21 ENCOUNTER — Encounter: Payer: Self-pay | Admitting: Internal Medicine

## 2010-09-21 MED ORDER — LISINOPRIL-HYDROCHLOROTHIAZIDE 20-12.5 MG PO TABS: 1.0000 | ORAL_TABLET | Freq: Every day | ORAL | Status: DC

## 2010-09-21 NOTE — Telephone Encounter (Signed)
LISINOPRIL HCTZ 20-12.5 TAB QTY 90 ONE TABLET DAILY LAST FILL 04-29-10

## 2010-09-21 NOTE — Telephone Encounter (Signed)
Rx refill sent to pharmacy. 

## 2010-10-01 ENCOUNTER — Telehealth: Payer: Self-pay | Admitting: *Deleted

## 2010-10-01 DIAGNOSIS — E785 Hyperlipidemia, unspecified: Secondary | ICD-10-CM

## 2010-10-01 DIAGNOSIS — I10 Essential (primary) hypertension: Secondary | ICD-10-CM

## 2010-10-01 LAB — HEPATIC FUNCTION PANEL
AST: 27 U/L (ref 0–37)
Albumin: 4.8 g/dL (ref 3.5–5.2)
Total Bilirubin: 0.7 mg/dL (ref 0.3–1.2)

## 2010-10-01 LAB — LIPID PANEL: HDL: 48 mg/dL (ref >39)

## 2010-10-01 LAB — HEMOGLOBIN A1C: Hgb A1c MFr Bld: 7.3 % — ABNORMAL HIGH (ref <5.7)

## 2010-10-01 NOTE — Telephone Encounter (Signed)
Pt at [facility] to have labs drawn. Orders entered and faxed to the lab.

## 2010-10-02 LAB — BASIC METABOLIC PANEL WITH GFR
BUN: 19 mg/dL (ref 6–23)
GFR, Est African American: >60 mL/min (ref >60)
GFR, Est Non African American: >60 mL/min (ref >60)
Potassium: 4.1 mEq/L (ref 3.5–5.3)

## 2010-10-04 ENCOUNTER — Encounter: Payer: Self-pay | Admitting: Family

## 2010-10-06 LAB — HM DIABETES EYE EXAM: HM Diabetic Eye Exam: NORMAL

## 2010-10-08 ENCOUNTER — Encounter: Payer: Self-pay | Admitting: Internal Medicine

## 2010-10-18 ENCOUNTER — Ambulatory Visit: Payer: Self-pay | Admitting: Internal Medicine

## 2010-11-08 ENCOUNTER — Ambulatory Visit: Payer: Self-pay | Admitting: Internal Medicine

## 2010-12-02 ENCOUNTER — Telehealth: Payer: Self-pay | Admitting: Internal Medicine

## 2010-12-02 ENCOUNTER — Encounter: Payer: Self-pay | Admitting: Internal Medicine

## 2010-12-02 ENCOUNTER — Ambulatory Visit (INDEPENDENT_AMBULATORY_CARE_PROVIDER_SITE_OTHER): Payer: BC Managed Care – PPO | Admitting: Internal Medicine

## 2010-12-02 DIAGNOSIS — E785 Hyperlipidemia, unspecified: Secondary | ICD-10-CM

## 2010-12-02 DIAGNOSIS — E119 Type 2 diabetes mellitus without complications: Secondary | ICD-10-CM

## 2010-12-02 DIAGNOSIS — I10 Essential (primary) hypertension: Secondary | ICD-10-CM

## 2010-12-02 MED ORDER — GLUCOSE BLOOD VI STRP: ORAL_STRIP | Status: DC

## 2010-12-02 NOTE — Progress Notes (Signed)
  Subjective:    Patient ID: Zachary Joyce, male    DOB: 01/19/1951, 60 y.o.   MRN: [id number]  HPI Pt presents to clinic for followup of multiple medical problems. Reviewed diabetic control with a1c 7.3. States week day fsbs 120's-130's without hypoglycemia. Weekend fsbs upper 100's secondary to diet.BP reviewed as nl. Diabetic eye exam utd. Tolerates statin tx without myalgias or abn lfts. States colonoscopy performed ~6 years ago in [place]. Was told had one polyp but everything was ok. Denies abd pain, change in bowel habit or blood in stool. No other complaints.     Past Medical History  Diagnosis Date  . Diabetes mellitus type II   . Hyperlipidemia   . Hypertension    No past surgical history on file.  reports that he has never smoked. He has never used smokeless tobacco. He reports that he does not drink alcohol. His drug history not on file. family history includes Cancer in his father and Lung cancer in his brother. No Known Allergies   Review of Systems see hpi     Objective:   Physical Exam  Nursing note and vitals reviewed. Constitutional: He appears well-developed and well-nourished. No distress.  HENT:  Head: Normocephalic and atraumatic.  Right Ear: External ear normal.  Left Ear: External ear normal.  Eyes: Conjunctivae are normal. No scleral icterus.  Neck: Neck supple. Carotid bruit is not present.  Cardiovascular: Normal rate, regular rhythm and normal heart sounds.  Exam reveals no gallop and no friction rub.   No murmur heard. Pulmonary/Chest: Effort normal and breath sounds normal. No respiratory distress. He has no wheezes. He has no rales.  Lymphadenopathy:    He has no cervical adenopathy.  Neurological: He is alert.  Skin: Skin is warm and dry. He is not diaphoretic.  Psychiatric: He has a normal mood and affect.          Assessment & Plan:

## 2010-12-02 NOTE — Patient Instructions (Signed)
Please schedule cbc, chem7, a1c  250.0 prior to next visit 

## 2010-12-02 NOTE — Assessment & Plan Note (Signed)
Normotensive and stable. Continue current regimen.

## 2010-12-02 NOTE — Assessment & Plan Note (Signed)
Mildly suboptimal control. Focus on consistent diabetic diet and exercise. Next visit consider adding agent if remains suboptimal.

## 2010-12-02 NOTE — Assessment & Plan Note (Signed)
Stable  Continue current regimen  

## 2010-12-02 NOTE — Telephone Encounter (Signed)
Glucose Blood(one touch ultra test) test strip  Pharmacy comments: need # of days supply and testing frequency for insurance please.

## 2010-12-03 ENCOUNTER — Ambulatory Visit: Payer: Self-pay | Admitting: Internal Medicine

## 2010-12-03 MED ORDER — GLUCOSE BLOOD VI STRP: ORAL_STRIP | Status: AC

## 2010-12-03 NOTE — Telephone Encounter (Signed)
Rx resent to pharmacy with patient instruction for use

## 2010-12-09 ENCOUNTER — Other Ambulatory Visit: Payer: Self-pay | Admitting: Internal Medicine

## 2010-12-09 DIAGNOSIS — E119 Type 2 diabetes mellitus without complications: Secondary | ICD-10-CM

## 2011-02-21 ENCOUNTER — Other Ambulatory Visit: Payer: Self-pay | Admitting: Internal Medicine

## 2011-02-21 LAB — BASIC METABOLIC PANEL
Potassium: 4.8 mEq/L (ref 3.5–5.3)
Sodium: 139 mEq/L (ref 135–145)

## 2011-02-21 LAB — CBC
Hemoglobin: 16 g/dL (ref 13.0–17.0)
MCHC: 34 g/dL (ref 30.0–36.0)
RDW: 12.7 % (ref 11.5–15.5)

## 2011-02-21 LAB — HEMOGLOBIN A1C
Hgb A1c MFr Bld: 6.9 % — ABNORMAL HIGH (ref <5.7)
Mean Plasma Glucose: 151 mg/dL — ABNORMAL HIGH (ref <117)

## 2011-02-21 NOTE — Progress Notes (Signed)
Addended by: Mervin Kung A on: 02/21/2011 09:44 AM   Modules accepted: Orders

## 2011-03-03 ENCOUNTER — Ambulatory Visit: Payer: BC Managed Care – PPO | Admitting: Internal Medicine

## 2011-03-04 ENCOUNTER — Ambulatory Visit (INDEPENDENT_AMBULATORY_CARE_PROVIDER_SITE_OTHER): Payer: BC Managed Care – PPO | Admitting: Internal Medicine

## 2011-03-04 ENCOUNTER — Encounter: Payer: Self-pay | Admitting: Internal Medicine

## 2011-03-04 VITALS — BP 132/80 | HR 76 | Temp 97.3°F | Resp 20 | Ht 70.0 in | Wt 176.0 lb

## 2011-03-04 DIAGNOSIS — E785 Hyperlipidemia, unspecified: Secondary | ICD-10-CM

## 2011-03-04 DIAGNOSIS — Z23 Encounter for immunization: Secondary | ICD-10-CM

## 2011-03-04 DIAGNOSIS — I10 Essential (primary) hypertension: Secondary | ICD-10-CM

## 2011-03-04 DIAGNOSIS — E119 Type 2 diabetes mellitus without complications: Secondary | ICD-10-CM

## 2011-03-04 NOTE — Progress Notes (Signed)
  Subjective:    Patient ID: Zachary Joyce, male    DOB: [birth year], 60 y.o.   MRN: [id number]  HPI Pt presents to clinic for followup of multiple medical problems. Diabetic control improving with a1c now 6.9. Tolerates statin tx without adverse effect. BP reviewed normotensive. No active complaints.  Past Medical History  Diagnosis Date  . Diabetes mellitus type II   . Hyperlipidemia   . Hypertension    No past surgical history on file.  reports that he has never smoked. He has never used smokeless tobacco. He reports that he does not drink alcohol. His drug history not on file. family history includes Cancer in his father and Lung cancer in his brother. No Known Allergies   Review of Systems see hpi    Objective:   Physical Exam  Physical Exam  Nursing note and vitals reviewed. Constitutional: Appears well-developed and well-nourished. No distress.  HENT:  Head: Normocephalic and atraumatic.  Right Ear: External ear normal.  Left Ear: External ear normal.  Eyes: Conjunctivae are normal. No scleral icterus.  Neck: Neck supple. Carotid bruit is not present.  Cardiovascular: Normal rate, regular rhythm and normal heart sounds.  Exam reveals no gallop and no friction rub.   No murmur heard. Pulmonary/Chest: Effort normal and breath sounds normal. No respiratory distress. He has no wheezes. no rales.  Lymphadenopathy:    He has no cervical adenopathy.  Neurological:Alert.  Skin: Skin is warm and dry. Not diaphoretic.  Psychiatric: Has a normal mood and affect.        Assessment & Plan:

## 2011-03-04 NOTE — Assessment & Plan Note (Signed)
Improving control. Continue current regimen. Obtain chem7 and a1c prior to next visit

## 2011-03-04 NOTE — Assessment & Plan Note (Signed)
Stable. Continue statin tx. Obtain lipid/lft prior to next visit 

## 2011-03-04 NOTE — Assessment & Plan Note (Signed)
Normotensive and stable. Continue current regimen. Monitor bp as outpt and followup in clinic as scheduled.  

## 2011-03-04 NOTE — Patient Instructions (Signed)
Please schedule chem7, a1c (250.0) and lipid/lft (272.4) prior to next visit 

## 2011-03-17 ENCOUNTER — Other Ambulatory Visit: Payer: Self-pay | Admitting: Internal Medicine

## 2011-03-17 NOTE — Telephone Encounter (Signed)
Rx refill sent to pharmacy. 

## 2011-05-09 ENCOUNTER — Other Ambulatory Visit: Payer: Self-pay | Admitting: Internal Medicine

## 2011-05-09 ENCOUNTER — Telehealth: Payer: Self-pay | Admitting: Internal Medicine

## 2011-05-10 NOTE — Telephone Encounter (Signed)
Rx refill sent to pharmacy. 

## 2011-08-18 ENCOUNTER — Other Ambulatory Visit: Payer: Self-pay | Admitting: Internal Medicine

## 2011-08-18 DIAGNOSIS — E785 Hyperlipidemia, unspecified: Secondary | ICD-10-CM

## 2011-08-18 DIAGNOSIS — E119 Type 2 diabetes mellitus without complications: Secondary | ICD-10-CM

## 2011-08-18 LAB — HEPATIC FUNCTION PANEL
ALT: 24 U/L (ref 0–53)
Bilirubin, Direct: 0.2 mg/dL (ref 0.0–0.3)
Indirect Bilirubin: 0.6 mg/dL (ref 0.0–0.9)
Total Bilirubin: 0.8 mg/dL (ref 0.3–1.2)

## 2011-08-18 LAB — BASIC METABOLIC PANEL
BUN: 17 mg/dL (ref 6–23)
CO2: 31 mEq/L (ref 19–32)
Calcium: 10 mg/dL (ref 8.4–10.5)
Creat: 1.08 mg/dL (ref 0.50–1.35)

## 2011-08-18 LAB — LIPID PANEL
Cholesterol: 153 mg/dL (ref 0–200)
VLDL: 16 mg/dL (ref 0–40)

## 2011-08-29 ENCOUNTER — Ambulatory Visit (INDEPENDENT_AMBULATORY_CARE_PROVIDER_SITE_OTHER): Payer: BC Managed Care – PPO | Admitting: Internal Medicine

## 2011-08-29 ENCOUNTER — Telehealth: Payer: Self-pay | Admitting: Internal Medicine

## 2011-08-29 ENCOUNTER — Encounter: Payer: Self-pay | Admitting: Internal Medicine

## 2011-08-29 VITALS — BP 112/78 | HR 69 | Temp 97.9°F | Resp 20 | Ht 70.0 in | Wt 175.0 lb

## 2011-08-29 DIAGNOSIS — E119 Type 2 diabetes mellitus without complications: Secondary | ICD-10-CM

## 2011-08-29 DIAGNOSIS — I10 Essential (primary) hypertension: Secondary | ICD-10-CM

## 2011-08-29 DIAGNOSIS — E785 Hyperlipidemia, unspecified: Secondary | ICD-10-CM

## 2011-08-29 MED ORDER — "PEN NEEDLES 5/16"" 31G X 8 MM MISC": Status: DC

## 2011-08-29 MED ORDER — INSULIN GLARGINE 100 UNIT/ML ~~LOC~~ SOLN: 30.0000 [IU] | SUBCUTANEOUS | Status: DC

## 2011-08-29 NOTE — Progress Notes (Signed)
  Subjective:    Patient ID: Zachary Joyce, male    DOB: [birth year], 61 y.o.   MRN: [id number]  HPI Pt presents to clinic for followup of multiple medical problems. Reviewed a1c increasing from 6.9 to 7.7. States fsbs was higher until ~2wks ago began to focus more on diet and exercise. Since then recent fsbs range declining to 120's generally. No hypoglycemia. Tolerating statin tx and ldl at goal. bp reviewed normotensive.   Past Medical History  Diagnosis Date  . Diabetes mellitus type II   . Hyperlipidemia   . Hypertension    No past surgical history on file.  reports that he has never smoked. He has never used smokeless tobacco. He reports that he does not drink alcohol. His drug history not on file. family history includes Cancer in his father and Lung cancer in his brother. No Known Allergies    Review of Systems see hpi     Objective:   Physical Exam  Physical Exam  Nursing note and vitals reviewed. Constitutional: Appears well-developed and well-nourished. No distress.  HENT:  Head: Normocephalic and atraumatic.  Right Ear: External ear normal.  Left Ear: External ear normal.  Eyes: Conjunctivae are normal. No scleral icterus.  Neck: Neck supple. Carotid bruit is not present.  Cardiovascular: Normal rate, regular rhythm and normal heart sounds.  Exam reveals no gallop and no friction rub.   No murmur heard. Pulmonary/Chest: Effort normal and breath sounds normal. No respiratory distress. He has no wheezes. no rales.  Lymphadenopathy:    He has no cervical adenopathy.  Neurological:Alert.  Skin: Skin is warm and dry. Not diaphoretic.  Psychiatric: Has a normal mood and affect.        Assessment & Plan:

## 2011-08-29 NOTE — Assessment & Plan Note (Signed)
Normotensive and stable. Continue current regimen. Monitor bp as outpt and followup in clinic as scheduled.  

## 2011-08-29 NOTE — Telephone Encounter (Signed)
Lab orders entered for August 2013. 

## 2011-08-29 NOTE — Assessment & Plan Note (Signed)
suboptimal control based on a1c however with lifestyle modification fsbs range improving. Continue current regimen and encouraged dietary changes and regular exercise. Obtain chem7, a1c, urine microalbumin prior to next visit.

## 2011-08-29 NOTE — Patient Instructions (Signed)
Please schedule labs prior to next visit Chem7, a1c, urine microalbumin-250.0 

## 2011-08-29 NOTE — Assessment & Plan Note (Signed)
At goal. Continue statin therapy

## 2011-09-19 ENCOUNTER — Telehealth: Payer: Self-pay | Admitting: Internal Medicine

## 2011-09-19 MED ORDER — LISINOPRIL-HYDROCHLOROTHIAZIDE 20-12.5 MG PO TABS: ORAL_TABLET | ORAL | Status: DC

## 2011-09-19 NOTE — Telephone Encounter (Signed)
Last refill 11.29.12 #90x1/Done.

## 2011-09-19 NOTE — Telephone Encounter (Signed)
Refill- lisino hctz 20-12.5 tab. Take one tablet by mouth every day. Qty 90 last fill 3.2.13

## 2011-11-07 ENCOUNTER — Other Ambulatory Visit: Payer: Self-pay | Admitting: Internal Medicine

## 2011-11-07 ENCOUNTER — Telehealth: Payer: Self-pay | Admitting: Internal Medicine

## 2011-11-07 MED ORDER — METFORMIN HCL 1000 MG PO TABS: ORAL_TABLET | ORAL | Status: DC

## 2011-11-07 NOTE — Telephone Encounter (Signed)
Rx request Done/SLS

## 2011-11-07 NOTE — Telephone Encounter (Signed)
Rx done/SLS 

## 2011-11-07 NOTE — Telephone Encounter (Signed)
Refill- metformin 1000mg  tab. Take one tablet by mouth twice daily. Qty 180 last fill 4.27.13

## 2011-12-02 ENCOUNTER — Ambulatory Visit: Payer: BC Managed Care – PPO | Admitting: Internal Medicine

## 2011-12-05 LAB — BASIC METABOLIC PANEL
BUN: 15 mg/dL (ref 6–23)
Calcium: 9.4 mg/dL (ref 8.4–10.5)
Creat: 1 mg/dL (ref 0.50–1.35)
Glucose, Bld: 194 mg/dL — ABNORMAL HIGH (ref 70–99)
Sodium: 138 mEq/L (ref 135–145)

## 2011-12-05 LAB — HEMOGLOBIN A1C: Hgb A1c MFr Bld: 9.7 % — ABNORMAL HIGH (ref <5.7)

## 2011-12-05 NOTE — Addendum Note (Signed)
Addended by: Regis Bill on: 12/05/2011 09:09 AM   Modules accepted: Orders

## 2011-12-06 LAB — MICROALBUMIN / CREATININE URINE RATIO: Creatinine, Urine: 110.3 mg/dL

## 2011-12-13 ENCOUNTER — Encounter: Payer: Self-pay | Admitting: Internal Medicine

## 2011-12-13 ENCOUNTER — Ambulatory Visit (INDEPENDENT_AMBULATORY_CARE_PROVIDER_SITE_OTHER): Payer: BC Managed Care – PPO | Admitting: Internal Medicine

## 2011-12-13 VITALS — BP 144/93 | HR 72 | Temp 97.7°F | Resp 16 | Wt 170.5 lb

## 2011-12-13 DIAGNOSIS — I10 Essential (primary) hypertension: Secondary | ICD-10-CM

## 2011-12-13 DIAGNOSIS — E119 Type 2 diabetes mellitus without complications: Secondary | ICD-10-CM

## 2011-12-13 MED ORDER — GLUCOSE BLOOD VI STRP: ORAL_STRIP | Status: DC

## 2011-12-13 MED ORDER — INSULIN GLARGINE 100 UNIT/ML ~~LOC~~ SOLN: 30.0000 [IU] | Freq: Every morning | SUBCUTANEOUS | Status: DC

## 2011-12-13 NOTE — Patient Instructions (Signed)
Please increase your lantus dose to 25 units a day. Then increase your dose 3 units every 3 days until your morning fasting sugars are consistently less than 130 without low blood sugars

## 2011-12-13 NOTE — Assessment & Plan Note (Signed)
suboptimal control. Increase lantus 25 units qd then increase dose 3 units q 3 days until fasting am glucose consistently less than 130 without hypoglycemia. Close f/u scheduled for review of glucose log. Begin exercise program.

## 2011-12-13 NOTE — Assessment & Plan Note (Signed)
Mildly elevated. Continue current regimen. Begin exercise. If remains elevated at close f/u then adjust medication.

## 2011-12-13 NOTE — Progress Notes (Signed)
  Subjective:    Patient ID: Zachary Joyce, male    DOB: 02-29-1951, 61 y.o.   MRN: [id number]  HPI Pt presents to clinic for followup of multiple medical problems. Reviewed increasing a1c now 9.7. States last fill of lantus too expensive so declined it. Began to reduce dose to 10 units and take injection intermittently. fsbs generally upper 100's to 232. No hypoglycemia.   Past Medical History  Diagnosis Date  . Diabetes mellitus type II   . Hyperlipidemia   . Hypertension    No past surgical history on file.  reports that he has never smoked. He has never used smokeless tobacco. He reports that he does not drink alcohol. His drug history not on file. family history includes Cancer in his father and Lung cancer in his brother. No Known Allergies    Review of Systems see hpi     Objective:   Physical Exam  Physical Exam  Nursing note and vitals reviewed. Constitutional: Appears well-developed and well-nourished. No distress.  HENT:  Head: Normocephalic and atraumatic.  Right Ear: External ear normal.  Left Ear: External ear normal.  Eyes: Conjunctivae are normal. No scleral icterus.  Neck: Neck supple. Carotid bruit is not present.  Cardiovascular: Normal rate, regular rhythm and normal heart sounds.  Exam reveals no gallop and no friction rub.   No murmur heard. Pulmonary/Chest: Effort normal and breath sounds normal. No respiratory distress. He has no wheezes. no rales.  Lymphadenopathy:    He has no cervical adenopathy.  Neurological:Alert.  Skin: Skin is warm and dry. Not diaphoretic.  Psychiatric: Has a normal mood and affect.        Assessment & Plan:

## 2012-01-16 ENCOUNTER — Ambulatory Visit (INDEPENDENT_AMBULATORY_CARE_PROVIDER_SITE_OTHER): Payer: BC Managed Care – PPO | Admitting: Internal Medicine

## 2012-01-16 ENCOUNTER — Encounter: Payer: Self-pay | Admitting: Internal Medicine

## 2012-01-16 VITALS — BP 124/84 | HR 80 | Temp 97.6°F | Resp 16 | Wt 170.5 lb

## 2012-01-16 DIAGNOSIS — I10 Essential (primary) hypertension: Secondary | ICD-10-CM

## 2012-01-16 DIAGNOSIS — Z23 Encounter for immunization: Secondary | ICD-10-CM

## 2012-01-16 DIAGNOSIS — E119 Type 2 diabetes mellitus without complications: Secondary | ICD-10-CM

## 2012-01-16 NOTE — Assessment & Plan Note (Signed)
Normotensive and stable. Continue current regimen. Monitor bp as outpt and followup in clinic as scheduled.  

## 2012-01-16 NOTE — Assessment & Plan Note (Signed)
Improved control. At target fingerstick blood sugars. Continue current regimen. Recheck Chem-7 and A1c prior to next visit

## 2012-01-16 NOTE — Progress Notes (Signed)
  Subjective:    Patient ID: Zachary Joyce, male    DOB: 26-Jan-1951, 61 y.o.   MRN: [id number]  HPI Pt presents to clinic for followup of multiple medical problems. Last visit had suboptimal control blood sugar as his insulin was expensive and had self decreased the dose. Refilled medication appropriately and increase the dose of Lantus to twenty-five units with instructions to increase further three units every three days until fasting blood sugars appropriately control. States not currently taking between twenty-five and twenty-eight units. Reviewed blood sugar log in detail. Now blood sugars averaging below one hundreds without hypoglycemia. Is exercising on a regular basis. No active complaint. Blood pressure reviewed as normotensive.  Past Medical History  Diagnosis Date  . Diabetes mellitus type II   . Hyperlipidemia   . Hypertension    No past surgical history on file.  reports that he has never smoked. He has never used smokeless tobacco. He reports that he does not drink alcohol. His drug history not on file. family history includes Cancer in his father and Lung cancer in his brother. No Known Allergies    Review of Systems see hpi     Objective:   Physical Exam  Nursing note and vitals reviewed. Constitutional: He appears well-developed and well-nourished. No distress.  HENT:  Head: Normocephalic.  Skin: He is not diaphoretic.          Assessment & Plan:

## 2012-01-16 NOTE — Patient Instructions (Signed)
Please schedule fasting labs prior to next visit Chem7, a1c-250.00 and lipid/lft-272.4 

## 2012-02-24 ENCOUNTER — Other Ambulatory Visit: Payer: Self-pay | Admitting: *Deleted

## 2012-02-24 DIAGNOSIS — E119 Type 2 diabetes mellitus without complications: Secondary | ICD-10-CM

## 2012-02-24 DIAGNOSIS — Z79899 Other long term (current) drug therapy: Secondary | ICD-10-CM

## 2012-02-24 DIAGNOSIS — E785 Hyperlipidemia, unspecified: Secondary | ICD-10-CM

## 2012-02-24 LAB — HEPATIC FUNCTION PANEL
AST: 24 U/L (ref 0–37)
Albumin: 4.4 g/dL (ref 3.5–5.2)
Alkaline Phosphatase: 44 U/L (ref 39–117)
Total Bilirubin: 0.8 mg/dL (ref 0.3–1.2)
Total Protein: 6.7 g/dL (ref 6.0–8.3)

## 2012-02-24 LAB — BASIC METABOLIC PANEL
Potassium: 4.5 mEq/L (ref 3.5–5.3)
Sodium: 140 mEq/L (ref 135–145)

## 2012-02-24 LAB — HEMOGLOBIN A1C
Hgb A1c MFr Bld: 7.2 % — ABNORMAL HIGH (ref <5.7)
Mean Plasma Glucose: 160 mg/dL — ABNORMAL HIGH (ref <117)

## 2012-02-24 LAB — LIPID PANEL
HDL: 39 mg/dL — ABNORMAL LOW (ref >39)
Triglycerides: 93 mg/dL (ref <150)

## 2012-02-24 NOTE — Progress Notes (Signed)
Lab only/SLS

## 2012-03-05 ENCOUNTER — Ambulatory Visit (INDEPENDENT_AMBULATORY_CARE_PROVIDER_SITE_OTHER): Payer: BC Managed Care – PPO | Admitting: Internal Medicine

## 2012-03-05 ENCOUNTER — Encounter: Payer: Self-pay | Admitting: Internal Medicine

## 2012-03-05 ENCOUNTER — Telehealth: Payer: Self-pay | Admitting: Internal Medicine

## 2012-03-05 VITALS — BP 122/70 | HR 80 | Temp 97.5°F | Resp 16 | Wt 170.5 lb

## 2012-03-05 DIAGNOSIS — I10 Essential (primary) hypertension: Secondary | ICD-10-CM

## 2012-03-05 DIAGNOSIS — E119 Type 2 diabetes mellitus without complications: Secondary | ICD-10-CM

## 2012-03-05 NOTE — Telephone Encounter (Signed)
Please schedule labs prior to next visit  Chem7, a1c-250.00  Patient has upcoming appointment beginning of march/2014. He will be going to [facility] lab

## 2012-03-05 NOTE — Patient Instructions (Signed)
Please schedule labs prior to next visit Chem7, a1c-250.00 

## 2012-03-11 NOTE — Assessment & Plan Note (Signed)
Improving control. Continue current regimen. Recommend diabetic diet and regular aerobic exercise.

## 2012-03-11 NOTE — Progress Notes (Signed)
  Subjective:    Patient ID: Zachary Joyce, male    DOB: 1951-01-22, 61 y.o.   MRN: [id number]  HPI Pt presents to clinic for followup of multiple medical problems. BP reviewed as normotensive. States fsbs low of 120 with isolated peak of 191. Denies hypoglycemia. Reviewed a1c improved to 7.2 from 9.7.  Past Medical History  Diagnosis Date  . Diabetes mellitus type II   . Hyperlipidemia   . Hypertension    No past surgical history on file.  reports that he has never smoked. He has never used smokeless tobacco. He reports that he does not drink alcohol. His drug history not on file. family history includes Cancer in his father and Lung cancer in his brother. No Known Allergies    Review of Systems see hpi     Objective:   Physical Exam  Physical Exam  Nursing note and vitals reviewed. Constitutional: Appears well-developed and well-nourished. No distress.  HENT:  Head: Normocephalic and atraumatic.  Right Ear: External ear normal.  Left Ear: External ear normal.  Eyes: Conjunctivae are normal. No scleral icterus.  Neck: Neck supple. Carotid bruit is not present.  Cardiovascular: Normal rate, regular rhythm and normal heart sounds.  Exam reveals no gallop and no friction rub.   No murmur heard. Pulmonary/Chest: Effort normal and breath sounds normal. No respiratory distress. He has no wheezes. no rales.  Lymphadenopathy:    He has no cervical adenopathy.  Neurological:Alert.  Skin: Skin is warm and dry. Not diaphoretic.  Psychiatric: Has a normal mood and affect.        Assessment & Plan:

## 2012-03-11 NOTE — Assessment & Plan Note (Signed)
Normotensive and stable. Continue current regimen. Monitor bp as outpt and followup in clinic as scheduled.  

## 2012-03-21 ENCOUNTER — Telehealth: Payer: Self-pay | Admitting: Internal Medicine

## 2012-03-21 MED ORDER — LISINOPRIL-HYDROCHLOROTHIAZIDE 20-12.5 MG PO TABS: ORAL_TABLET | ORAL | Status: DC

## 2012-03-21 NOTE — Telephone Encounter (Signed)
Rx to pharmacy/SLS 

## 2012-03-21 NOTE — Telephone Encounter (Signed)
Refill- prinzide 20-12.5tab. Take one tablet by mouth every day. Qty 90 last fill 9.1.13

## 2012-04-20 NOTE — Op Note (Signed)
MRN: [id number] DOCUMENT ID: [id number]      INTRODUCTION:      62 YEAR OLD MALE PATIENT PRESENTS FOR AN OUTPATIENT COLONOSCOPY.  THE      INDICATION FOR THE PROCEDURE WAS RECTAL BLEEDING.      CONSENT:      THE BENEFITS, RISKS, AND ALTERNATIVES TO THE PROCEDURE WERE DISCUSSED AND      INFORMED CONSENT WAS OBTAINED.      PREPARATION:      EKG, PULSE, PULSE OXIMETRY AND BLOOD PRESSURE MONITORED.      MEDICATIONS:      - MAC ANESTHESIA WAS ADMINISTERED THROUGHOUT THE PROCEDURE      PROCEDURE:      RECTAL EXAM: NORMAL.      THE ENDOSCOPE WAS PASSED WITH EASE UNDER DIRECT VISUALIZATION TO THE CECUM      CONFIRMED BY APPENDICEAL ORIFICE AND ILEOCECAL VALVE.  RETROFLEXION WAS      PERFORMED IN THE RECTUM.      FINDINGS:  FOUR SMALL POLYPS WERE SEEN IN THE CECUM, THE ASCENDING COLON      AND THE SIGMOID.  ALL POLYPS WERE REMOVED BY COLD BIOPSY FORCEPS.  SMALL      INTERNAL HEMORRHOIDS WERE PRESENT.  THE COLONOSCOPY WAS OTHERWISE NORMAL.      IMPRESSION:      1.  FOUR SMALL POLYPS IN THE CECUM, THE ASCENDING COLON AND THE SIGMOID.      [211.3]. ALL POLYPS WERE REMOVED BY COLD BIOPSY FORCEPS.      2.  SMALL INTERNAL HEMORRHOIDS [455.0].      RECOMMENDATION:      - AWAIT BIOPSY RESULTS.      - REPEAT COLONOSCOPY IN 3 YEARS.      SIGNING PHYSICIAN: Milan Perkins I

## 2012-05-18 ENCOUNTER — Encounter: Payer: Self-pay | Admitting: Internal Medicine

## 2012-05-21 ENCOUNTER — Telehealth: Payer: Self-pay | Admitting: Internal Medicine

## 2012-05-21 MED ORDER — METFORMIN HCL 1000 MG PO TABS: ORAL_TABLET | ORAL | Status: DC

## 2012-05-21 MED ORDER — SIMVASTATIN 80 MG PO TABS: 80.0000 mg | ORAL_TABLET | Freq: Every day | ORAL | Status: DC

## 2012-05-21 NOTE — Telephone Encounter (Signed)
Refill- metformin 1000mg  tab. Take one tablet by mouth twice daily. Qty 180 last fill 11.2.13  Refill- zocor 80mg  tab. Take one-half tablet by mouth at bedtime. Qty 90 last fill 11.2.13

## 2012-05-24 ENCOUNTER — Ambulatory Visit: Payer: BC Managed Care – PPO

## 2012-06-13 ENCOUNTER — Telehealth: Payer: Self-pay | Admitting: *Deleted

## 2012-06-13 DIAGNOSIS — E119 Type 2 diabetes mellitus without complications: Secondary | ICD-10-CM

## 2012-06-13 LAB — BASIC METABOLIC PANEL
BUN: 14 mg/dL (ref 6–23)
CO2: 31 mEq/L (ref 19–32)
Glucose, Bld: 104 mg/dL — ABNORMAL HIGH (ref 70–99)
Potassium: 4.2 mEq/L (ref 3.5–5.3)

## 2012-06-13 NOTE — Telephone Encounter (Signed)
Pt presented to the lab. Orders entered per 03/05/12 office note as below:  Please schedule labs prior to next visit  Chem7, a1c-250.00

## 2012-06-21 ENCOUNTER — Ambulatory Visit (INDEPENDENT_AMBULATORY_CARE_PROVIDER_SITE_OTHER): Payer: BC Managed Care – PPO | Admitting: Family Medicine

## 2012-06-21 ENCOUNTER — Encounter: Payer: Self-pay | Admitting: Family Medicine

## 2012-06-21 VITALS — BP 136/82 | HR 79 | Temp 97.6°F | Ht 70.0 in | Wt 176.1 lb

## 2012-06-21 DIAGNOSIS — E785 Hyperlipidemia, unspecified: Secondary | ICD-10-CM

## 2012-06-21 DIAGNOSIS — I10 Essential (primary) hypertension: Secondary | ICD-10-CM

## 2012-06-21 DIAGNOSIS — E119 Type 2 diabetes mellitus without complications: Secondary | ICD-10-CM

## 2012-06-21 MED ORDER — INSULIN GLARGINE 100 UNIT/ML ~~LOC~~ SOLN: 32.0000 [IU] | Freq: Every morning | SUBCUTANEOUS | Status: DC

## 2012-06-21 MED ORDER — SIMVASTATIN 80 MG PO TABS: 40.0000 mg | ORAL_TABLET | Freq: Every day | ORAL | Status: DC

## 2012-06-21 NOTE — Patient Instructions (Addendum)
Next appt 4-6 months, annual with labs prior lipid, microalb, hgba1c, liver, renal, cbc, tsh, psa

## 2012-06-22 ENCOUNTER — Ambulatory Visit: Payer: BC Managed Care – PPO | Admitting: Internal Medicine

## 2012-06-23 ENCOUNTER — Encounter: Payer: Self-pay | Admitting: Family Medicine

## 2012-06-24 NOTE — Assessment & Plan Note (Signed)
Well controlled on Simvastatin, avoid trans fats

## 2012-06-24 NOTE — Assessment & Plan Note (Signed)
Well controlled today, no changes 

## 2012-06-24 NOTE — Progress Notes (Signed)
Patient ID: Zachary Joyce, male   DOB: 11-Nov-1950, 62 y.o.   MRN: [id number] Zachary Joyce [id number] [birth year] 06/24/2012      Progress Note-Follow Up  Subjective  Chief Complaint  Chief Complaint  Patient presents with  . Follow-up    3 month    HPI  Patient is a 62 year old Asian male who is in today for followup. He feels well. He did not deceased not been following his diabetic diet well. No acute complaints though. No chest pain, palpitations, shortness of breath, GI or GU complaints. No recent illness, congestion. He is taking his medications as prescribed.  Past Medical History  Diagnosis Date  . Diabetes mellitus type II   . Hyperlipidemia   . Hypertension     History reviewed. No pertinent past surgical history.  Family History  Problem Relation Age of Onset  . Cancer Father     deceased at age 10 GI cancer  . Lung cancer Brother     deceased at age 74    History   Social History  . Marital Status: Married    Spouse Name: N/A    Number of Children: N/A  . Years of Education: N/A   Occupational History  . Not on file.   Social History Main Topics  . Smoking status: Never Smoker   . Smokeless tobacco: Never Used  . Alcohol Use: No  . Drug Use: Not on file  . Sexually Active: Not on file   Other Topics Concern  . Not on file   Social History Narrative   Married   works at [employer]   Alcohol use-no     Tobacco use -no       Current Outpatient Prescriptions on File Prior to Visit  Medication Sig Dispense Refill  . Blood Glucose Monitoring Suppl (ONE TOUCH ULTRA SYSTEM KIT) W/DEVICE KIT 1 kit by Does not apply route once. Use to check blood sugar twice a day-Dx Code 250.00       . calcium-vitamin D (OSCAL 500/200 D-3) 500-200 MG-UNIT per tablet Take 1 tablet by mouth daily.        Marland Kitchen glucose blood test strip Use as instructed twice daily to test blood glucose. Dx: 250.00  200 each  12  . Insulin Pen Needle (PEN NEEDLES 31GX5/16") 31G X 8 MM  MISC Use once a day as directed  100 each  11  . lisinopril-hydrochlorothiazide (PRINZIDE,ZESTORETIC) 20-12.5 MG per tablet TAKE ONE TABLET BY MOUTH EVERY DAY  90 tablet  1  . metFORMIN (GLUCOPHAGE) 1000 MG tablet TAKE ONE TABLET BY MOUTH TWICE DAILY  180 tablet  1  . Multiple Vitamin (MULTIVITAMIN) tablet Take 1 tablet by mouth daily.        . Omega-3 Fatty Acids (FISH OIL) 1000 MG CAPS Take 1,000 mg by mouth 2 (two) times daily.         No current facility-administered medications on file prior to visit.    No Known Allergies  Review of Systems  Review of Systems  Constitutional: Negative for fever and malaise/fatigue.  HENT: Negative for congestion.   Eyes: Negative for discharge.  Respiratory: Negative for shortness of breath.   Cardiovascular: Negative for chest pain, palpitations and leg swelling.  Gastrointestinal: Negative for nausea, abdominal pain and diarrhea.  Genitourinary: Negative for dysuria.  Musculoskeletal: Negative for falls.  Skin: Negative for rash.  Neurological: Negative for loss of consciousness and headaches.  Endo/Heme/Allergies: Negative for polydipsia.  Psychiatric/Behavioral: Negative for  depression and suicidal ideas. The patient is not nervous/anxious and does not have insomnia.     Objective  BP 136/82  Pulse 79  Temp(Src) 97.6 F (36.4 C) (Oral)  Ht 5\' 10"  (1.778 m)  Wt 176 lb 1.9 oz (79.888 kg)  BMI 25.27 kg/m2  SpO2 97%  Physical Exam  Physical Exam  Constitutional: He is oriented to person, place, and time and well-developed, well-nourished, and in no distress. No distress.  HENT:  Head: Normocephalic and atraumatic.  Eyes: Conjunctivae are normal.  Neck: Neck supple. No thyromegaly present.  Cardiovascular: Normal rate, regular rhythm and normal heart sounds.   Pulmonary/Chest: Effort normal and breath sounds normal. No respiratory distress.  Abdominal: He exhibits no distension and no mass. There is no tenderness.   Musculoskeletal: He exhibits no edema.  Neurological: He is alert and oriented to person, place, and time.  Skin: Skin is warm.  Psychiatric: Memory, affect and judgment normal.    Lab Results  Component Value Date   TSH 0.98 06/07/2007   Lab Results  Component Value Date   WBC 6.9 02/21/2011   HGB 16.0 02/21/2011   HCT 47.1 02/21/2011   MCV 95.9 02/21/2011   PLT 196 02/21/2011   Lab Results  Component Value Date   CREATININE 1.02 06/13/2012   BUN 14 06/13/2012   NA 137 06/13/2012   K 4.2 06/13/2012   CL 98 06/13/2012   CO2 31 06/13/2012   Lab Results  Component Value Date   ALT 23 02/24/2012   AST 24 02/24/2012   ALKPHOS 44 02/24/2012   BILITOT 0.8 02/24/2012   Lab Results  Component Value Date   CHOL 136 02/24/2012   Lab Results  Component Value Date   HDL 39* 02/24/2012   Lab Results  Component Value Date   LDLCALC 78 02/24/2012   Lab Results  Component Value Date   TRIG 93 02/24/2012   Lab Results  Component Value Date   CHOLHDL 3.5 02/24/2012     Assessment & Plan  HYPERTENSION Well controlled today, no changes  DIABETES MELLITUS, TYPE II hgba1c up to 7.9, minimize, simple carbs and increase exercise, increase Lantus to 32 units daily and continue Metformin  HYPERLIPIDEMIA Well controlled on Simvastatin, avoid trans fats

## 2012-06-24 NOTE — Assessment & Plan Note (Signed)
hgba1c up to 7.9, minimize, simple carbs and increase exercise, increase Lantus to 32 units daily and continue Metformin

## 2012-09-14 ENCOUNTER — Other Ambulatory Visit: Payer: Self-pay | Admitting: Internal Medicine

## 2012-10-25 ENCOUNTER — Other Ambulatory Visit: Payer: Self-pay

## 2012-11-20 ENCOUNTER — Other Ambulatory Visit: Payer: Self-pay | Admitting: *Deleted

## 2012-11-20 MED ORDER — METFORMIN HCL 1000 MG PO TABS: ORAL_TABLET | ORAL | Status: DC

## 2012-11-20 NOTE — Telephone Encounter (Signed)
Rx request to pharmacy/SLS  

## 2012-12-12 ENCOUNTER — Encounter: Payer: Self-pay | Admitting: Family Medicine

## 2012-12-13 ENCOUNTER — Telehealth: Payer: Self-pay

## 2012-12-13 DIAGNOSIS — I10 Essential (primary) hypertension: Secondary | ICD-10-CM

## 2012-12-13 DIAGNOSIS — E785 Hyperlipidemia, unspecified: Secondary | ICD-10-CM

## 2012-12-13 DIAGNOSIS — E119 Type 2 diabetes mellitus without complications: Secondary | ICD-10-CM

## 2012-12-13 LAB — CBC
HCT: 42.5 % (ref 39.0–52.0)
Hemoglobin: 14.7 g/dL (ref 13.0–17.0)
MCV: 95.1 fL (ref 78.0–100.0)
RBC: 4.47 MIL/uL (ref 4.22–5.81)
WBC: 6.9 10*3/uL (ref 4.0–10.5)

## 2012-12-13 NOTE — Telephone Encounter (Signed)
Lab order placed.

## 2012-12-14 LAB — LIPID PANEL
Cholesterol: 147 mg/dL (ref 0–200)
LDL Cholesterol: 81 mg/dL (ref 0–99)
VLDL: 22 mg/dL (ref 0–40)

## 2012-12-14 LAB — RENAL FUNCTION PANEL
Albumin: 4.8 g/dL (ref 3.5–5.2)
CO2: 26 mEq/L (ref 19–32)
Calcium: 10 mg/dL (ref 8.4–10.5)
Sodium: 137 mEq/L (ref 135–145)

## 2012-12-14 LAB — MICROALBUMIN, URINE: Microalb, Ur: 0.5 mg/dL (ref 0.00–1.89)

## 2012-12-14 LAB — HEPATIC FUNCTION PANEL
Bilirubin, Direct: 0.1 mg/dL (ref 0.0–0.3)
Total Bilirubin: 0.6 mg/dL (ref 0.3–1.2)

## 2012-12-14 LAB — HEMOGLOBIN A1C: Mean Plasma Glucose: 194 mg/dL — ABNORMAL HIGH (ref <117)

## 2012-12-20 ENCOUNTER — Ambulatory Visit (INDEPENDENT_AMBULATORY_CARE_PROVIDER_SITE_OTHER): Payer: BC Managed Care – PPO | Admitting: Family Medicine

## 2012-12-20 ENCOUNTER — Encounter: Payer: Self-pay | Admitting: Family Medicine

## 2012-12-20 VITALS — BP 108/68 | HR 81 | Temp 97.8°F | Ht 70.0 in | Wt 174.1 lb

## 2012-12-20 DIAGNOSIS — I10 Essential (primary) hypertension: Secondary | ICD-10-CM

## 2012-12-20 DIAGNOSIS — Z86010 Personal history of colonic polyps: Secondary | ICD-10-CM

## 2012-12-20 DIAGNOSIS — E785 Hyperlipidemia, unspecified: Secondary | ICD-10-CM

## 2012-12-20 DIAGNOSIS — E119 Type 2 diabetes mellitus without complications: Secondary | ICD-10-CM

## 2012-12-20 DIAGNOSIS — Z23 Encounter for immunization: Secondary | ICD-10-CM

## 2012-12-20 NOTE — Patient Instructions (Addendum)

## 2012-12-23 ENCOUNTER — Encounter: Payer: Self-pay | Admitting: Family Medicine

## 2012-12-23 DIAGNOSIS — Z860100 Personal history of colon polyps, unspecified: Secondary | ICD-10-CM | POA: Insufficient documentation

## 2012-12-23 HISTORY — DX: Personal history of colonic polyps: Z86.010

## 2012-12-23 NOTE — Progress Notes (Signed)
Patient ID: Zachary Joyce, male   DOB: [birth year], 62 y.o.   MRN: [id number] Zachary Joyce [id number] [birth year] 12/23/2012      Progress Note-Follow Up  Subjective  Chief Complaint  Chief Complaint  Patient presents with  . Follow-up    6 month    HPI  Patient is a 62 year old Asian male in today for followup. He's feeling well. He reports his blood sugars morning was 87. The lowest he seen in the last month is 70 the highest 248. Denies polyuria or polydipsia. No GI or GU complaints. Last colonoscopy was in 2006 they dis remove some polyps but he was told he could repeat in 10 years. No chest pain, palpitations, shortness of breath, GI or GU complaints noted today.  Past Medical History  Diagnosis Date  . Diabetes mellitus type II   . Hyperlipidemia   . Hypertension     History reviewed. No pertinent past surgical history.  Family History  Problem Relation Age of Onset  . Cancer Father     deceased at age 82 GI cancer  . Lung cancer Brother     deceased at age 22    History   Social History  . Marital Status: Married    Spouse Name: N/A    Number of Children: N/A  . Years of Education: N/A   Occupational History  . Not on file.   Social History Main Topics  . Smoking status: Never Smoker   . Smokeless tobacco: Never Used  . Alcohol Use: No  . Drug Use: Not on file  . Sexual Activity: Not on file   Other Topics Concern  . Not on file   Social History Narrative   Married   works at [employer]   Alcohol use-no     Tobacco use -no       Current Outpatient Prescriptions on File Prior to Visit  Medication Sig Dispense Refill  . Blood Glucose Monitoring Suppl (ONE TOUCH ULTRA SYSTEM KIT) W/DEVICE KIT 1 kit by Does not apply route once. Use to check blood sugar twice a day-Dx Code 250.00       . calcium-vitamin D (OSCAL 500/200 D-3) 500-200 MG-UNIT per tablet Take 1 tablet by mouth daily.        . Insulin Pen Needle (PEN NEEDLES 31GX5/16") 31G X 8 MM MISC  Use once a day as directed  100 each  11  . lisinopril-hydrochlorothiazide (PRINZIDE,ZESTORETIC) 20-12.5 MG per tablet Take 1 tablet by mouth daily.  90 tablet  1  . metFORMIN (GLUCOPHAGE) 1000 MG tablet TAKE ONE TABLET BY MOUTH TWICE DAILY  180 tablet  1  . Multiple Vitamin (MULTIVITAMIN) tablet Take 1 tablet by mouth daily.        . Omega-3 Fatty Acids (FISH OIL) 1000 MG CAPS Take 1,000 mg by mouth 2 (two) times daily.        . simvastatin (ZOCOR) 80 MG tablet Take 0.5 tablets (40 mg total) by mouth at bedtime.  90 tablet  1   No current facility-administered medications on file prior to visit.    No Known Allergies  Review of Systems  Review of Systems  Constitutional: Negative for fever and malaise/fatigue.  HENT: Negative for congestion.   Eyes: Negative for discharge.  Respiratory: Negative for shortness of breath.   Cardiovascular: Negative for chest pain, palpitations and leg swelling.  Gastrointestinal: Negative for nausea, abdominal pain and diarrhea.  Genitourinary: Negative for dysuria.  Musculoskeletal: Negative for falls.  Skin: Negative for rash.  Neurological: Negative for loss of consciousness and headaches.  Endo/Heme/Allergies: Negative for polydipsia.  Psychiatric/Behavioral: Negative for depression and suicidal ideas. The patient is not nervous/anxious and does not have insomnia.     Objective  BP 108/68  Pulse 81  Temp(Src) 97.8 F (36.6 C) (Oral)  Ht 5\' 10"  (1.778 m)  Wt 174 lb 1.9 oz (78.98 kg)  BMI 24.98 kg/m2  SpO2 98%  Physical Exam  Physical Exam  Constitutional: He is oriented to person, place, and time and well-developed, well-nourished, and in no distress. No distress.  HENT:  Head: Normocephalic and atraumatic.  Eyes: Conjunctivae are normal.  Neck: Neck supple. No thyromegaly present.  Cardiovascular: Normal rate, regular rhythm and normal heart sounds.   No murmur heard. Pulmonary/Chest: Effort normal and breath sounds normal. No  respiratory distress.  Abdominal: He exhibits no distension and no mass. There is no tenderness.  Musculoskeletal: He exhibits no edema.  Neurological: He is alert and oriented to person, place, and time.  Skin: Skin is warm.  Psychiatric: Memory, affect and judgment normal.    Lab Results  Component Value Date   TSH 2.453 12/13/2012   Lab Results  Component Value Date   WBC 6.9 12/13/2012   HGB 14.7 12/13/2012   HCT 42.5 12/13/2012   MCV 95.1 12/13/2012   PLT 204 12/13/2012   Lab Results  Component Value Date   CREATININE 1.16 12/13/2012   BUN 27* 12/13/2012   NA 137 12/13/2012   K 4.8 12/13/2012   CL 102 12/13/2012   CO2 26 12/13/2012   Lab Results  Component Value Date   ALT 36 12/13/2012   AST 33 12/13/2012   ALKPHOS 50 12/13/2012   BILITOT 0.6 12/13/2012   Lab Results  Component Value Date   CHOL 147 12/13/2012   Lab Results  Component Value Date   HDL 44 12/13/2012   Lab Results  Component Value Date   LDLCALC 81 12/13/2012   Lab Results  Component Value Date   TRIG 108 12/13/2012   Lab Results  Component Value Date   CHOLHDL 3.3 12/13/2012     Assessment & Plan  HYPERTENSION Well controlled, no changes  DIABETES MELLITUS, TYPE II hgba1c is up. Encouraged to avoid simple carbs. Sugars are improving but is asked to increase Lantus by 2 units and report hi or lo numbers.  HYPERLIPIDEMIA Avoid trans fats, add krill oil continue Simvastatin.

## 2012-12-23 NOTE — Assessment & Plan Note (Signed)
Avoid trans fats, add krill oil continue Simvastatin.

## 2012-12-23 NOTE — Assessment & Plan Note (Signed)
Well controlled, no changes 

## 2012-12-23 NOTE — Assessment & Plan Note (Signed)
hgba1c is up. Encouraged to avoid simple carbs. Sugars are improving but is asked to increase Lantus by 2 units and report hi or lo numbers.

## 2012-12-25 ENCOUNTER — Encounter: Payer: Self-pay | Admitting: Family Medicine

## 2013-01-21 ENCOUNTER — Other Ambulatory Visit: Payer: Self-pay | Admitting: Internal Medicine

## 2013-01-21 NOTE — Telephone Encounter (Signed)
Rx request to pharmacy/SLS  

## 2013-03-19 ENCOUNTER — Other Ambulatory Visit: Payer: Self-pay | Admitting: Family Medicine

## 2013-06-06 ENCOUNTER — Other Ambulatory Visit: Payer: Self-pay | Admitting: Family Medicine

## 2013-06-19 ENCOUNTER — Encounter: Payer: Self-pay | Admitting: Physician Assistant

## 2013-06-19 ENCOUNTER — Ambulatory Visit (INDEPENDENT_AMBULATORY_CARE_PROVIDER_SITE_OTHER): Payer: BC Managed Care – PPO | Admitting: Physician Assistant

## 2013-06-19 VITALS — BP 132/86 | HR 98 | Temp 97.9°F | Resp 16 | Ht 70.0 in | Wt 168.0 lb

## 2013-06-19 DIAGNOSIS — M5430 Sciatica, unspecified side: Secondary | ICD-10-CM

## 2013-06-19 DIAGNOSIS — M545 Low back pain, unspecified: Secondary | ICD-10-CM

## 2013-06-19 DIAGNOSIS — M5440 Lumbago with sciatica, unspecified side: Principal | ICD-10-CM

## 2013-06-19 MED ORDER — PREDNISONE 20 MG PO TABS: 20.0000 mg | ORAL_TABLET | Freq: Every day | ORAL | Status: DC

## 2013-06-19 MED ORDER — TRAMADOL HCL 50 MG PO TABS: 50.0000 mg | ORAL_TABLET | Freq: Three times a day (TID) | ORAL | Status: DC | PRN

## 2013-06-19 MED ORDER — CYCLOBENZAPRINE HCL 10 MG PO TABS: 10.0000 mg | ORAL_TABLET | Freq: Three times a day (TID) | ORAL | Status: AC | PRN

## 2013-06-19 NOTE — Progress Notes (Signed)
Pre visit review using our clinic review tool, if applicable. No additional management support is needed unless otherwise documented below in the visit note/SLS  

## 2013-06-19 NOTE — Patient Instructions (Addendum)
Please take prednisone as directed.  Continue taking Insulin and Metformin as prescribed.  Monitor blood sugars.  If you blood sugar gets up to 300 and remains elevated, please stop steroids and call us.  Take tramadol as directed for moderate-severe pain.  If pain is improving, please tale tylenol or ibuprofen instead.  Take Flexeril as prescribed.  Do not operate vehicle while taking Flexeril.  Avoid heavy lifting or overexertion.  If symptoms are not improving with medications we will need to obtain imaging and set you up with an Orthopedist.

## 2013-06-23 DIAGNOSIS — M545 Low back pain, unspecified: Secondary | ICD-10-CM | POA: Insufficient documentation

## 2013-06-23 DIAGNOSIS — M5440 Lumbago with sciatica, unspecified side: Principal | ICD-10-CM

## 2013-06-23 NOTE — Progress Notes (Signed)
Patient presents to clinic today c/o right low back and thigh pain radiating into his lower leg x 4-5 days.  Pain has been present intermittently and is worse with movement.  Denies numbness, tingling or weakness of lower extremity. Denies hx of injury or trauma to lower back.  Does exercise regularly at the gym but denies known, inciting event.  Has not taken anything for pain.  Patient has history of DM II that is controlled with insulin and metformin. Patient checks blood sugars regularly.  Past Medical History  Diagnosis Date  . Diabetes mellitus type II   . Hyperlipidemia   . Hypertension   . Personal history of colonic polyps 12/23/2012    Current Outpatient Prescriptions on File Prior to Visit  Medication Sig Dispense Refill  . Blood Glucose Monitoring Suppl (ONE TOUCH ULTRA SYSTEM KIT) W/DEVICE KIT 1 kit by Does not apply route once. Use to check blood sugar twice a day-Dx Code 250.00       . calcium-vitamin D (OSCAL 500/200 D-3) 500-200 MG-UNIT per tablet Take 1 tablet by mouth daily.        Marland Kitchen glucose blood (ONE TOUCH ULTRA TEST) test strip USE AS DIRECTED TO TEST BLOOD GLUCOSE TWICE DAILY Dx: 250.00  200 each  5  . insulin glargine (LANTUS) 100 UNIT/ML injection Inject 40 Units into the skin every morning. Please provide 90-day supply to patient. Dx: 250.00      . Insulin Pen Needle (PEN NEEDLES 31GX5/16") 31G X 8 MM MISC Use once a day as directed  100 each  11  . lisinopril-hydrochlorothiazide (PRINZIDE,ZESTORETIC) 20-12.5 MG per tablet TAKE ONE TABLET BY MOUTH ONCE DAILY  90 tablet  0  . metFORMIN (GLUCOPHAGE) 1000 MG tablet TAKE ONE TABLET BY MOUTH TWICE DAILY  180 tablet  0  . Multiple Vitamin (MULTIVITAMIN) tablet Take 1 tablet by mouth daily.        . Omega-3 Fatty Acids (FISH OIL) 1000 MG CAPS Take 1,000 mg by mouth 2 (two) times daily.        . simvastatin (ZOCOR) 80 MG tablet Take 0.5 tablets (40 mg total) by mouth at bedtime.  90 tablet  1   No current facility-administered  medications on file prior to visit.    No Known Allergies  Family History  Problem Relation Age of Onset  . Cancer Father     deceased at age 38 GI cancer  . Lung cancer Brother     deceased at age 22    History   Social History  . Marital Status: Married    Spouse Name: N/A    Number of Children: N/A  . Years of Education: N/A   Social History Main Topics  . Smoking status: Never Smoker   . Smokeless tobacco: Never Used  . Alcohol Use: No  . Drug Use: None  . Sexual Activity: None   Other Topics Concern  . None   Social History Narrative   Married   works at [employer]   Alcohol use-no     Tobacco use -no      Review of Systems - See HPI.  All other ROS are negative.  BP 132/86  Pulse 98  Temp(Src) 97.9 F (36.6 C) (Oral)  Resp 16  Ht _0  (1.778 m)  Wt 168 lb (76.204 kg)  BMI 24.11 kg/m2  SpO2 97%  Physical Exam  Vitals reviewed. Constitutional: He is oriented to person, place, and time and well-developed, well-nourished, and in  no distress.  HENT:  Head: Normocephalic and atraumatic.  Eyes: Conjunctivae are normal. Pupils are equal, round, and reactive to light.  Neck: Neck supple.  Cardiovascular: Normal rate, regular rhythm and normal heart sounds.   Pulmonary/Chest: Effort normal and breath sounds normal.  Musculoskeletal:       Cervical back: Normal.       Thoracic back: Normal.       Lumbar back: He exhibits pain and spasm. He exhibits normal range of motion, no bony tenderness, no swelling, no edema, no deformity and no laceration.       Right upper leg: He exhibits tenderness. He exhibits no bony tenderness, no swelling, no edema, no deformity and no laceration.       Left upper leg: Normal.       Right lower leg: Normal.  Neurological: He is alert and oriented to person, place, and time. He has normal reflexes. No cranial nerve deficit. Gait normal.  Skin: Skin is warm and dry. No rash noted.  Psychiatric: Affect normal.    No  results found for this or any previous visit (from the past 2160 hour(s)).  Assessment/Plan: Low back pain with radiation Negative injury/trauma. Intermittent symptoms < 1 week. With sciatica.  Rx Tramadol for severe pain.  Flexeril at bedtime.  Steroid dose pack to calm sciatic nerve.  Patient to check glucose 3+ times per day.  Stop steroid if glucose 300+ and call office.  If symptoms persist, we will need to obtain imaging.

## 2013-06-23 NOTE — Assessment & Plan Note (Signed)
Negative injury/trauma. Intermittent symptoms < 1 week. With sciatica.  Rx Tramadol for severe pain.  Flexeril at bedtime.  Steroid dose pack to calm sciatic nerve.  Patient to check glucose 3+ times per day.  Stop steroid if glucose 300+ and call office.  If symptoms persist, we will need to obtain imaging.

## 2013-06-24 ENCOUNTER — Ambulatory Visit (HOSPITAL_BASED_OUTPATIENT_CLINIC_OR_DEPARTMENT_OTHER)
Admission: RE | Admit: 2013-06-24 | Discharge: 2013-06-24 | Disposition: A | Discharge status: Home / Self Care | Payer: BC Managed Care – PPO | Source: Ambulatory Visit | Attending: Physician Assistant | Admitting: Physician Assistant

## 2013-06-24 ENCOUNTER — Telehealth: Payer: Self-pay | Admitting: Physician Assistant

## 2013-06-24 ENCOUNTER — Ambulatory Visit (INDEPENDENT_AMBULATORY_CARE_PROVIDER_SITE_OTHER): Payer: BC Managed Care – PPO | Admitting: Physician Assistant

## 2013-06-24 ENCOUNTER — Encounter: Payer: Self-pay | Admitting: Physician Assistant

## 2013-06-24 VITALS — BP 144/85 | HR 98 | Temp 97.6°F | Resp 16 | Ht 70.0 in | Wt 170.2 lb

## 2013-06-24 DIAGNOSIS — M545 Low back pain, unspecified: Secondary | ICD-10-CM

## 2013-06-24 DIAGNOSIS — M79604 Pain in right leg: Secondary | ICD-10-CM

## 2013-06-24 DIAGNOSIS — M5430 Sciatica, unspecified side: Secondary | ICD-10-CM

## 2013-06-24 DIAGNOSIS — M5440 Lumbago with sciatica, unspecified side: Secondary | ICD-10-CM

## 2013-06-24 NOTE — Progress Notes (Signed)
Patient presents to clinic today c/o continued low back pain with some radiation down right leg despite conservative treatment with Flexeril and short course of steroids. Patient has noticed a good improvement in his symptoms but still has occasional pain radiating to lower leg.  Does lift weights, but has not since onset of symptoms prior to last visit..  Denies new symptoms.  Denies saddle anesthesia, change to bowel or bladder habits.  Past Medical History  Diagnosis Date  . Diabetes mellitus type II   . Hyperlipidemia   . Hypertension   . Personal history of colonic polyps 12/23/2012    Current Outpatient Prescriptions on File Prior to Visit  Medication Sig Dispense Refill  . Blood Glucose Monitoring Suppl (ONE TOUCH ULTRA SYSTEM KIT) W/DEVICE KIT 1 kit by Does not apply route once. Use to check blood sugar twice a day-Dx Code 250.00       . calcium-vitamin D (OSCAL 500/200 D-3) 500-200 MG-UNIT per tablet Take 1 tablet by mouth daily.        . cyclobenzaprine (FLEXERIL) 10 MG tablet Take 1 tablet (10 mg total) by mouth 3 (three) times daily as needed for muscle spasms.  30 tablet  0  . glucose blood (ONE TOUCH ULTRA TEST) test strip USE AS DIRECTED TO TEST BLOOD GLUCOSE TWICE DAILY Dx: 250.00  200 each  5  . insulin glargine (LANTUS) 100 UNIT/ML injection Inject 40 Units into the skin every morning. Please provide 90-day supply to patient. Dx: 250.00      . Insulin Pen Needle (PEN NEEDLES 31GX5/16") 31G X 8 MM MISC Use once a day as directed  100 each  11  . lisinopril-hydrochlorothiazide (PRINZIDE,ZESTORETIC) 20-12.5 MG per tablet TAKE ONE TABLET BY MOUTH ONCE DAILY  90 tablet  0  . metFORMIN (GLUCOPHAGE) 1000 MG tablet TAKE ONE TABLET BY MOUTH TWICE DAILY  180 tablet  0  . Multiple Vitamin (MULTIVITAMIN) tablet Take 1 tablet by mouth daily.        . Omega-3 Fatty Acids (FISH OIL) 1000 MG CAPS Take 1,000 mg by mouth 2 (two) times daily.        . simvastatin (ZOCOR) 80 MG tablet Take 0.5  tablets (40 mg total) by mouth at bedtime.  90 tablet  1  . traMADol (ULTRAM) 50 MG tablet Take 1 tablet (50 mg total) by mouth every 8 (eight) hours as needed.  30 tablet  0   No current facility-administered medications on file prior to visit.    No Known Allergies  Family History  Problem Relation Age of Onset  . Cancer Father     deceased at age 44 GI cancer  . Lung cancer Brother     deceased at age 77    History   Social History  . Marital Status: Married    Spouse Name: N/A    Number of Children: N/A  . Years of Education: N/A   Social History Main Topics  . Smoking status: Never Smoker   . Smokeless tobacco: Never Used  . Alcohol Use: No  . Drug Use: None  . Sexual Activity: None   Other Topics Concern  . None   Social History Narrative   Married   works at [employer]   Alcohol use-no     Tobacco use -no      Review of Systems - See HPI.  All other ROS are negative.  BP 144/85  Pulse 98  Temp(Src) 97.6 F (36.4 C) (Oral)  Resp 16  Ht _0  (1.778 m)  Wt 170 lb 4 oz (77.225 kg)  BMI 24.43 kg/m2  SpO2 100%  Physical Exam  Vitals reviewed. Constitutional: He is oriented to person, place, and time and well-developed, well-nourished, and in no distress.  HENT:  Head: Normocephalic and atraumatic.  Eyes: Conjunctivae are normal. Pupils are equal, round, and reactive to light.  Neck: Neck supple.  Cardiovascular: Normal rate, regular rhythm and normal heart sounds.   Pulmonary/Chest: Effort normal and breath sounds normal.  Musculoskeletal:       Thoracic back: Normal.       Lumbar back: He exhibits tenderness. He exhibits no bony tenderness, no swelling, no edema, no deformity and no laceration.       Right upper leg: Normal.       Left upper leg: Normal.       Right lower leg: Normal.       Left lower leg: Normal.  Negative straight leg and cross-straight leg raise tests.  Neurological: He is alert and oriented to person, place, and time.   Skin: Skin is warm and dry. No rash noted.  Psychiatric: Affect normal.   Assessment/Plan: Low back pain with radiation Patient has not filed or taken Rx pain medication given at last visit.  Symptoms are improving which is reassuring.  Fill Tramadol Rx.  Take as prescribed.  Flexeril at bedtime.  Will obtain DG Lumbar spine.  Avoid heavy lifting or overexertion.  If symptoms do not continue to improve and x-ray unremarkable, patient will need referral to Orthopedist and/or PT.

## 2013-06-24 NOTE — Patient Instructions (Signed)
Please continue taking Flexeril as directed.  Avoid heavy lifting or over exertion.  Please get the Tramadol filled and take as directed, if needed, for moderate to severe back pain.  Please go to the [facility] to have x-ray obtained.  If symptoms do not continue to improve and imaging is negative, we will need to send you to be evaluated by an Orthopedist.  Sciatica Sciatica is pain, weakness, numbness, or tingling along the path of the sciatic nerve. The nerve starts in the lower back and runs down the back of each leg. The nerve controls the muscles in the lower leg and in the back of the knee, while also providing sensation to the back of the thigh, lower leg, and the sole of your foot. Sciatica is a symptom of another medical condition. For instance, nerve damage or certain conditions, such as a herniated disk or bone spur on the spine, pinch or put pressure on the sciatic nerve. This causes the pain, weakness, or other sensations normally associated with sciatica. Generally, sciatica only affects one side of the body. CAUSES   Herniated or slipped disc.  Degenerative disk disease.  A pain disorder involving the narrow muscle in the buttocks (piriformis syndrome).  Pelvic injury or fracture.  Pregnancy.  Tumor (rare). SYMPTOMS  Symptoms can vary from mild to very severe. The symptoms usually travel from the low back to the buttocks and down the back of the leg. Symptoms can include:  Mild tingling or dull aches in the lower back, leg, or hip.  Numbness in the back of the calf or sole of the foot.  Burning sensations in the lower back, leg, or hip.  Sharp pains in the lower back, leg, or hip.  Leg weakness.  Severe back pain inhibiting movement. These symptoms may get worse with coughing, sneezing, laughing, or prolonged sitting or standing. Also, being overweight may worsen symptoms. DIAGNOSIS  Your caregiver will perform a physical exam to look for common symptoms of sciatica.  He or she may ask you to do certain movements or activities that would trigger sciatic nerve pain. Other tests may be performed to find the cause of the sciatica. These may include:  Blood tests.  X-rays.  Imaging tests, such as an MRI or CT scan. TREATMENT  Treatment is directed at the cause of the sciatic pain. Sometimes, treatment is not necessary and the pain and discomfort goes away on its own. If treatment is needed, your caregiver may suggest:  Over-the-counter medicines to relieve pain.  Prescription medicines, such as anti-inflammatory medicine, muscle relaxants, or narcotics.  Applying heat or ice to the painful area.  Steroid injections to lessen pain, irritation, and inflammation around the nerve.  Reducing activity during periods of pain.  Exercising and stretching to strengthen your abdomen and improve flexibility of your spine. Your caregiver may suggest losing weight if the extra weight makes the back pain worse.  Physical therapy.  Surgery to eliminate what is pressing or pinching the nerve, such as a bone spur or part of a herniated disk. HOME CARE INSTRUCTIONS   Only take over-the-counter or prescription medicines for pain or discomfort as directed by your caregiver.  Apply ice to the affected area for 20 minutes, 3 4 times a day for the first 48 72 hours. Then try heat in the same way.  Exercise, stretch, or perform your usual activities if these do not aggravate your pain.  Attend physical therapy sessions as directed by your caregiver.  Keep  all follow-up appointments as directed by your caregiver.  Do not wear high heels or shoes that do not provide proper support.  Check your mattress to see if it is too soft. A firm mattress may lessen your pain and discomfort. SEEK IMMEDIATE MEDICAL CARE IF:   You lose control of your bowel or bladder (incontinence).  You have increasing weakness in the lower back, pelvis, buttocks, or legs.  You have redness or  swelling of your back.  You have a burning sensation when you urinate.  You have pain that gets worse when you lie down or awakens you at night.  Your pain is worse than you have experienced in the past.  Your pain is lasting longer than 4 weeks.  You are suddenly losing weight without reason. MAKE SURE YOU:  Understand these instructions.  Will watch your condition.  Will get help right away if you are not doing well or get worse. Document Released: 03/29/2001 Document Revised: 10/04/2011 Document Reviewed: 08/14/2011 [facility] Patient Information 2014 [place].

## 2013-06-24 NOTE — Telephone Encounter (Signed)
Referral to Orthopedic surgery placed.

## 2013-06-24 NOTE — Progress Notes (Signed)
Pre visit review using our clinic review tool, if applicable. No additional management support is needed unless otherwise documented below in the visit note/SLS  

## 2013-06-24 NOTE — Assessment & Plan Note (Signed)
Patient has not filed or taken Rx pain medication given at last visit.  Symptoms are improving which is reassuring.  Fill Tramadol Rx.  Take as prescribed.  Flexeril at bedtime.  Will obtain DG Lumbar spine.  Avoid heavy lifting or overexertion.  If symptoms do not continue to improve and x-ray unremarkable, patient will need referral to Orthopedist and/or PT.

## 2013-06-27 ENCOUNTER — Telehealth: Payer: Self-pay

## 2013-06-27 DIAGNOSIS — E119 Type 2 diabetes mellitus without complications: Secondary | ICD-10-CM

## 2013-06-27 DIAGNOSIS — E785 Hyperlipidemia, unspecified: Secondary | ICD-10-CM

## 2013-06-27 DIAGNOSIS — I10 Essential (primary) hypertension: Secondary | ICD-10-CM

## 2013-06-27 NOTE — Telephone Encounter (Signed)
Needs hgba1c, lipid, renal, hepatic, tsh, cbc

## 2013-06-27 NOTE — Telephone Encounter (Signed)
Pt came in today to have labs drawn. Please advise which labs need to be done and diagnosis?

## 2013-06-28 LAB — CBC
HCT: 47.9 % (ref 39.0–52.0)
Hemoglobin: 16.9 g/dL (ref 13.0–17.0)
MCH: 33.1 pg (ref 26.0–34.0)
MCHC: 35.3 g/dL (ref 30.0–36.0)
MCV: 93.9 fL (ref 78.0–100.0)
Platelets: 226 10*3/uL (ref 150–400)
RBC: 5.1 MIL/uL (ref 4.22–5.81)
RDW: 14.1 % (ref 11.5–15.5)
WBC: 4.3 10*3/uL (ref 4.0–10.5)

## 2013-06-28 LAB — LIPID PANEL
Cholesterol: 181 mg/dL (ref 0–200)
HDL: 38 mg/dL — AB (ref >39)
LDL Cholesterol: 91 mg/dL (ref 0–99)
TRIGLYCERIDES: 260 mg/dL — AB (ref <150)
Total CHOL/HDL Ratio: 4.8 Ratio
VLDL: 52 mg/dL — AB (ref 0–40)

## 2013-06-28 LAB — HEPATIC FUNCTION PANEL
ALBUMIN: 4.6 g/dL (ref 3.5–5.2)
ALT: 26 U/L (ref 0–53)
AST: 26 U/L (ref 0–37)
Alkaline Phosphatase: 53 U/L (ref 39–117)
BILIRUBIN DIRECT: 0.1 mg/dL (ref 0.0–0.3)
BILIRUBIN TOTAL: 0.8 mg/dL (ref 0.2–1.2)
Indirect Bilirubin: 0.7 mg/dL (ref 0.2–1.2)
Total Protein: 7.3 g/dL (ref 6.0–8.3)

## 2013-06-28 LAB — HEMOGLOBIN A1C
Hgb A1c MFr Bld: 7.9 % — ABNORMAL HIGH (ref <5.7)
Mean Plasma Glucose: 180 mg/dL — ABNORMAL HIGH (ref <117)

## 2013-06-28 LAB — RENAL FUNCTION PANEL
ALBUMIN: 4.6 g/dL (ref 3.5–5.2)
BUN: 30 mg/dL — ABNORMAL HIGH (ref 6–23)
CO2: 29 mEq/L (ref 19–32)
CREATININE: 1.28 mg/dL (ref 0.50–1.35)
Calcium: 10.5 mg/dL (ref 8.4–10.5)
Chloride: 94 mEq/L — ABNORMAL LOW (ref 96–112)
Glucose, Bld: 125 mg/dL — ABNORMAL HIGH (ref 70–99)
PHOSPHORUS: 4 mg/dL (ref 2.3–4.6)
Potassium: 4.6 mEq/L (ref 3.5–5.3)
Sodium: 131 mEq/L — ABNORMAL LOW (ref 135–145)

## 2013-06-29 LAB — TSH: TSH: 4.058 u[IU]/mL (ref 0.350–4.500)

## 2013-07-03 ENCOUNTER — Encounter: Payer: Self-pay | Admitting: *Deleted

## 2013-07-04 ENCOUNTER — Ambulatory Visit (INDEPENDENT_AMBULATORY_CARE_PROVIDER_SITE_OTHER): Payer: BC Managed Care – PPO | Admitting: Family Medicine

## 2013-07-04 ENCOUNTER — Encounter: Payer: Self-pay | Admitting: Family Medicine

## 2013-07-04 VITALS — HR 95 | Temp 98.0°F | Ht 63.0 in | Wt 167.0 lb

## 2013-07-04 DIAGNOSIS — Z0000 Encounter for general adult medical examination without abnormal findings: Secondary | ICD-10-CM

## 2013-07-04 DIAGNOSIS — M545 Low back pain, unspecified: Secondary | ICD-10-CM

## 2013-07-04 DIAGNOSIS — Z23 Encounter for immunization: Secondary | ICD-10-CM

## 2013-07-04 DIAGNOSIS — E119 Type 2 diabetes mellitus without complications: Secondary | ICD-10-CM

## 2013-07-04 DIAGNOSIS — E785 Hyperlipidemia, unspecified: Secondary | ICD-10-CM

## 2013-07-04 DIAGNOSIS — M549 Dorsalgia, unspecified: Secondary | ICD-10-CM

## 2013-07-04 DIAGNOSIS — M5440 Lumbago with sciatica, unspecified side: Secondary | ICD-10-CM

## 2013-07-04 DIAGNOSIS — I10 Essential (primary) hypertension: Secondary | ICD-10-CM

## 2013-07-04 DIAGNOSIS — M5430 Sciatica, unspecified side: Secondary | ICD-10-CM

## 2013-07-04 MED ORDER — METFORMIN HCL 1000 MG PO TABS: ORAL_TABLET | ORAL | Status: DC

## 2013-07-04 MED ORDER — SIMVASTATIN 80 MG PO TABS: 40.0000 mg | ORAL_TABLET | Freq: Every day | ORAL | Status: DC

## 2013-07-04 MED ORDER — PNEUMOCOCCAL 13-VAL CONJ VACC IM SUSP: 0.5000 mL | Freq: Once | INTRAMUSCULAR | Status: DC

## 2013-07-04 NOTE — Progress Notes (Signed)
Pre visit review using our clinic review tool, if applicable. No additional management support is needed unless otherwise documented below in the visit note. 

## 2013-07-04 NOTE — Patient Instructions (Signed)

## 2013-07-07 ENCOUNTER — Encounter: Payer: Self-pay | Admitting: Family Medicine

## 2013-07-07 DIAGNOSIS — M545 Low back pain, unspecified: Secondary | ICD-10-CM

## 2013-07-07 DIAGNOSIS — Z0000 Encounter for general adult medical examination without abnormal findings: Secondary | ICD-10-CM

## 2013-07-07 HISTORY — DX: Encounter for general adult medical examination without abnormal findings: Z00.00

## 2013-07-07 HISTORY — DX: Low back pain, unspecified: M54.50

## 2013-07-07 NOTE — Assessment & Plan Note (Signed)
Following with Raliegh Ip has MRI this week. Has been getting relief with Percocet temporarily

## 2013-07-07 NOTE — Progress Notes (Signed)
Patient ID: Zachary Joyce, male   DOB: [birth year], 63 y.o.   MRN: [id number] [facility]S [id number] Aug 20, 1950 07/07/2013      Progress Note-Follow Up  Subjective  Chief Complaint  Chief Complaint  Patient presents with  . Annual Exam    physical  . Injections    prevnar    HPI  Patient is a 63 year old male in today for routine medical care. Patient has been struggling with increased low back pain. Has been taking a course of steroids and some Tylenol with minimal relief. Has established with orthopedics and they actually have a MRI scheduled for later this week. No incontinence or radicular symptoms. Has not been taking his simvastatin for the last 2 weeks just because he ran out no difficulty with the medication. Blood sugars have been running on average 120 to 130 but he has had a low of 88 and a high of 178. No recent illness. Denies CP/palp/SOB/HA/congestion/fevers/GI or GU c/o. Taking meds as prescribed  Past Medical History  Diagnosis Date  . Diabetes mellitus type II   . Hyperlipidemia   . Hypertension   . Personal history of colonic polyps 12/23/2012  . Preventative health care 07/07/2013    History reviewed. No pertinent past surgical history.  Family History  Problem Relation Age of Onset  . Diabetes Father   . Diabetes Brother   . Kidney disease Brother   . Heart disease Mother     MI  . Hyperlipidemia Mother   . Hypertension Mother   . Cancer Brother     lung, heavy smoker  . Lung cancer Brother     History   Social History  . Marital Status: Married    Spouse Name: N/A    Number of Children: N/A  . Years of Education: N/A   Occupational History  . Not on file.   Social History Main Topics  . Smoking status: Never Smoker   . Smokeless tobacco: Never Used  . Alcohol Use: No  . Drug Use: Not on file  . Sexual Activity: Yes     Comment: lives with wife, exercises, heart healthy diet   Other Topics Concern  . Not on file   Social History  Narrative   Married   works at [employer]   Alcohol use-no     Tobacco use -no       Current Outpatient Prescriptions on File Prior to Visit  Medication Sig Dispense Refill  . Blood Glucose Monitoring Suppl (ONE TOUCH ULTRA SYSTEM KIT) W/DEVICE KIT 1 kit by Does not apply route once. Use to check blood sugar twice a day-Dx Code 250.00       . calcium-vitamin D (OSCAL 500/200 D-3) 500-200 MG-UNIT per tablet Take 1 tablet by mouth daily.        . cyclobenzaprine (FLEXERIL) 10 MG tablet Take 1 tablet (10 mg total) by mouth 3 (three) times daily as needed for muscle spasms.  30 tablet  0  . glucose blood (ONE TOUCH ULTRA TEST) test strip USE AS DIRECTED TO TEST BLOOD GLUCOSE TWICE DAILY Dx: 250.00  200 each  5  . insulin glargine (LANTUS) 100 UNIT/ML injection Inject 40 Units into the skin every morning. Please provide 90-day supply to patient. Dx: 250.00      . Insulin Pen Needle (PEN NEEDLES 31GX5/16") 31G X 8 MM MISC Use once a day as directed  100 each  11  . lisinopril-hydrochlorothiazide (PRINZIDE,ZESTORETIC) 20-12.5 MG per tablet  TAKE ONE TABLET BY MOUTH ONCE DAILY  90 tablet  0  . Multiple Vitamin (MULTIVITAMIN) tablet Take 1 tablet by mouth daily.        . Omega-3 Fatty Acids (FISH OIL) 1000 MG CAPS Take 1,000 mg by mouth 2 (two) times daily.        . traMADol (ULTRAM) 50 MG tablet Take 1 tablet (50 mg total) by mouth every 8 (eight) hours as needed.  30 tablet  0   No current facility-administered medications on file prior to visit.    No Known Allergies  Review of Systems  Review of Systems  Constitutional: Negative for fever, chills and malaise/fatigue.  HENT: Negative for congestion, hearing loss and nosebleeds.   Eyes: Negative for discharge.  Respiratory: Negative for cough, sputum production, shortness of breath and wheezing.   Cardiovascular: Negative for chest pain, palpitations and leg swelling.  Gastrointestinal: Negative for heartburn, nausea, vomiting, abdominal  pain, diarrhea, constipation and blood in stool.  Genitourinary: Negative for dysuria, urgency, frequency and hematuria.  Musculoskeletal: Positive for back pain. Negative for falls and myalgias.       Low back pain  Skin: Negative for rash.  Neurological: Negative for dizziness, tremors, sensory change, focal weakness, loss of consciousness, weakness and headaches.  Endo/Heme/Allergies: Negative for polydipsia. Does not bruise/bleed easily.  Psychiatric/Behavioral: Negative for depression and suicidal ideas. The patient is not nervous/anxious and does not have insomnia.     Objective  Pulse 95  Temp(Src) 98 F (36.7 C) (Oral)  Ht _0  (1.6 m)  Wt 167 lb (75.751 kg)  BMI 29.59 kg/m2  SpO2 98%  Physical Exam  Physical Exam  Constitutional: He is oriented to person, place, and time and well-developed, well-nourished, and in no distress. No distress.  HENT:  Head: Normocephalic and atraumatic.  Eyes: Conjunctivae are normal.  Neck: Neck supple. No thyromegaly present.  Cardiovascular: Normal rate, regular rhythm and normal heart sounds.   No murmur heard. Pulmonary/Chest: Effort normal and breath sounds normal. No respiratory distress.  Abdominal: He exhibits no distension and no mass. There is no tenderness.  Musculoskeletal: He exhibits no edema.  Neurological: He is alert and oriented to person, place, and time.  Skin: Skin is warm.  Psychiatric: Memory, affect and judgment normal.    Lab Results  Component Value Date   TSH 4.058 06/28/2013   Lab Results  Component Value Date   WBC 4.3 06/28/2013   HGB 16.9 06/28/2013   HCT 47.9 06/28/2013   MCV 93.9 06/28/2013   PLT 226 06/28/2013   Lab Results  Component Value Date   CREATININE 1.28 06/28/2013   BUN 30* 06/28/2013   NA 131* 06/28/2013   K 4.6 06/28/2013   CL 94* 06/28/2013   CO2 29 06/28/2013   Lab Results  Component Value Date   ALT 26 06/28/2013   AST 26 06/28/2013   ALKPHOS 53 06/28/2013   BILITOT 0.8 06/28/2013    Lab Results  Component Value Date   CHOL 181 06/28/2013   Lab Results  Component Value Date   HDL 38* 06/28/2013   Lab Results  Component Value Date   LDLCALC 91 06/28/2013   Lab Results  Component Value Date   TRIG 260* 06/28/2013   Lab Results  Component Value Date   CHOLHDL 4.8 06/28/2013     Assessment & Plan  DIABETES MELLITUS, TYPE II hgba1c elevated minimize simple carbs. Increase exercise as tolerated. increase current meds  HYPERTENSION Well controlled, no changes  to meds. Encouraged heart healthy diet such as the DASH diet and exercise as tolerated.   HYPERLIPIDEMIA Tolerating statin, encouraged heart healthy diet, avoid trans fats, minimize simple carbs and saturated fats. Increase exercise as tolerated  Preventative health care Patient encouraged to maintain heart healthy diet, regular exercise, adequate sleep. Consider daily probiotics. Take medications as prescribed. Given Prevnar tonight  Low back pain with radiation Following with Raliegh Ip has MRI this week. Has been getting relief with Percocet temporarily

## 2013-07-07 NOTE — Assessment & Plan Note (Signed)
hgba1c elevated minimize simple carbs. Increase exercise as tolerated. increase current meds

## 2013-07-07 NOTE — Assessment & Plan Note (Signed)
Tolerating statin, encouraged heart healthy diet, avoid trans fats, minimize simple carbs and saturated fats. Increase exercise as tolerated 

## 2013-07-07 NOTE — Assessment & Plan Note (Signed)
Well controlled, no changes to meds. Encouraged heart healthy diet such as the DASH diet and exercise as tolerated.  °

## 2013-07-07 NOTE — Assessment & Plan Note (Signed)
Patient encouraged to maintain heart healthy diet, regular exercise, adequate sleep. Consider daily probiotics. Take medications as prescribed. Given Prevnar tonight

## 2013-07-10 ENCOUNTER — Ambulatory Visit (INDEPENDENT_AMBULATORY_CARE_PROVIDER_SITE_OTHER): Payer: BC Managed Care – PPO | Admitting: Physical Therapy

## 2013-07-10 DIAGNOSIS — R5381 Other malaise: Secondary | ICD-10-CM

## 2013-07-10 DIAGNOSIS — M2550 Pain in unspecified joint: Secondary | ICD-10-CM

## 2013-07-10 DIAGNOSIS — M545 Low back pain, unspecified: Secondary | ICD-10-CM

## 2013-07-10 DIAGNOSIS — M259 Joint disorder, unspecified: Secondary | ICD-10-CM

## 2013-07-11 ENCOUNTER — Ambulatory Visit: Payer: BC Managed Care – PPO | Admitting: Rehabilitation

## 2013-07-12 ENCOUNTER — Telehealth: Payer: Self-pay

## 2013-07-12 NOTE — Telephone Encounter (Signed)
Relevant patient education mailed to patient.  

## 2013-07-15 ENCOUNTER — Encounter (INDEPENDENT_AMBULATORY_CARE_PROVIDER_SITE_OTHER): Payer: BC Managed Care – PPO

## 2013-07-15 DIAGNOSIS — M545 Low back pain, unspecified: Secondary | ICD-10-CM

## 2013-07-15 DIAGNOSIS — M2550 Pain in unspecified joint: Secondary | ICD-10-CM

## 2013-07-15 DIAGNOSIS — R5381 Other malaise: Secondary | ICD-10-CM

## 2013-07-15 DIAGNOSIS — M2560 Stiffness of unspecified joint, not elsewhere classified: Secondary | ICD-10-CM

## 2013-07-17 ENCOUNTER — Encounter (INDEPENDENT_AMBULATORY_CARE_PROVIDER_SITE_OTHER): Payer: BC Managed Care – PPO | Admitting: Physical Therapy

## 2013-07-17 DIAGNOSIS — M2550 Pain in unspecified joint: Secondary | ICD-10-CM

## 2013-07-17 DIAGNOSIS — M2560 Stiffness of unspecified joint, not elsewhere classified: Secondary | ICD-10-CM

## 2013-07-17 DIAGNOSIS — R5381 Other malaise: Secondary | ICD-10-CM

## 2013-07-17 DIAGNOSIS — M545 Low back pain, unspecified: Secondary | ICD-10-CM

## 2013-07-22 ENCOUNTER — Encounter (INDEPENDENT_AMBULATORY_CARE_PROVIDER_SITE_OTHER): Payer: BC Managed Care – PPO | Admitting: Physical Therapy

## 2013-07-22 DIAGNOSIS — M545 Low back pain, unspecified: Secondary | ICD-10-CM

## 2013-07-22 DIAGNOSIS — M2560 Stiffness of unspecified joint, not elsewhere classified: Secondary | ICD-10-CM

## 2013-07-22 DIAGNOSIS — M2550 Pain in unspecified joint: Secondary | ICD-10-CM

## 2013-07-22 DIAGNOSIS — R5381 Other malaise: Secondary | ICD-10-CM

## 2013-07-24 ENCOUNTER — Encounter (INDEPENDENT_AMBULATORY_CARE_PROVIDER_SITE_OTHER): Payer: BC Managed Care – PPO

## 2013-07-24 DIAGNOSIS — M545 Low back pain, unspecified: Secondary | ICD-10-CM

## 2013-07-24 DIAGNOSIS — R5381 Other malaise: Secondary | ICD-10-CM

## 2013-07-24 DIAGNOSIS — M2560 Stiffness of unspecified joint, not elsewhere classified: Secondary | ICD-10-CM

## 2013-07-24 DIAGNOSIS — M2550 Pain in unspecified joint: Secondary | ICD-10-CM

## 2013-07-29 ENCOUNTER — Encounter (INDEPENDENT_AMBULATORY_CARE_PROVIDER_SITE_OTHER): Payer: BC Managed Care – PPO | Admitting: Physical Therapy

## 2013-07-29 DIAGNOSIS — M5106 Intervertebral disc disorders with myelopathy, lumbar region: Secondary | ICD-10-CM

## 2013-07-29 DIAGNOSIS — M2550 Pain in unspecified joint: Secondary | ICD-10-CM

## 2013-07-29 DIAGNOSIS — M2560 Stiffness of unspecified joint, not elsewhere classified: Secondary | ICD-10-CM

## 2013-07-29 DIAGNOSIS — R5381 Other malaise: Secondary | ICD-10-CM

## 2013-07-29 DIAGNOSIS — M545 Low back pain, unspecified: Secondary | ICD-10-CM

## 2013-07-31 ENCOUNTER — Encounter (INDEPENDENT_AMBULATORY_CARE_PROVIDER_SITE_OTHER): Payer: BC Managed Care – PPO

## 2013-07-31 DIAGNOSIS — M2560 Stiffness of unspecified joint, not elsewhere classified: Secondary | ICD-10-CM

## 2013-07-31 DIAGNOSIS — M2550 Pain in unspecified joint: Secondary | ICD-10-CM

## 2013-07-31 DIAGNOSIS — M545 Low back pain, unspecified: Secondary | ICD-10-CM

## 2013-07-31 DIAGNOSIS — R5381 Other malaise: Secondary | ICD-10-CM

## 2013-07-31 DIAGNOSIS — M5106 Intervertebral disc disorders with myelopathy, lumbar region: Secondary | ICD-10-CM

## 2013-08-21 ENCOUNTER — Other Ambulatory Visit: Payer: Self-pay | Admitting: Internal Medicine

## 2013-08-26 ENCOUNTER — Telehealth: Payer: Self-pay | Admitting: Family Medicine

## 2013-08-26 MED ORDER — INSULIN GLARGINE 100 UNIT/ML SOLOSTAR PEN: 40.0000 [IU] | PEN_INJECTOR | Freq: Every morning | SUBCUTANEOUS | Status: DC

## 2013-08-26 NOTE — Telephone Encounter (Signed)
Prentiss on [employer] from Pharmacy: patient says suppose to be injecting 40 units, not 30 units. Please verify.

## 2013-09-07 ENCOUNTER — Other Ambulatory Visit: Payer: Self-pay | Admitting: Family Medicine

## 2013-10-30 ENCOUNTER — Telehealth: Payer: Self-pay

## 2013-10-30 NOTE — Telephone Encounter (Signed)
Diabetic bundle:  Left a message for patient to return my call.   Pt needs to come in for a BP check- nurse visit  Please schedule when he calls back  thanks

## 2013-10-31 NOTE — Telephone Encounter (Signed)
Daughter called back for patient, explained what patient needed and why, daughter voiced understanding and will have father call back to schedule

## 2013-12-18 ENCOUNTER — Other Ambulatory Visit: Payer: Self-pay | Admitting: Family Medicine

## 2013-12-30 ENCOUNTER — Other Ambulatory Visit (INDEPENDENT_AMBULATORY_CARE_PROVIDER_SITE_OTHER): Payer: BC Managed Care – PPO

## 2013-12-30 DIAGNOSIS — E785 Hyperlipidemia, unspecified: Secondary | ICD-10-CM

## 2013-12-30 LAB — RENAL FUNCTION PANEL
Albumin: 3.8 g/dL (ref 3.5–5.2)
BUN: 16 mg/dL (ref 6–23)
CALCIUM: 9.5 mg/dL (ref 8.4–10.5)
CHLORIDE: 101 meq/L (ref 96–112)
CO2: 28 mEq/L (ref 19–32)
CREATININE: 1 mg/dL (ref 0.4–1.5)
GFR: 81.44 mL/min (ref >60.00)
Glucose, Bld: 140 mg/dL — ABNORMAL HIGH (ref 70–99)
Phosphorus: 2.7 mg/dL (ref 2.3–4.6)
Potassium: 4.3 mEq/L (ref 3.5–5.1)
Sodium: 137 mEq/L (ref 135–145)

## 2013-12-30 LAB — LIPID PANEL
CHOLESTEROL: 145 mg/dL (ref 0–200)
HDL: 36.2 mg/dL — AB (ref >39.00)
LDL Cholesterol: 80 mg/dL (ref 0–99)
NonHDL: 108.8
Total CHOL/HDL Ratio: 4
Triglycerides: 144 mg/dL (ref 0.0–149.0)
VLDL: 28.8 mg/dL (ref 0.0–40.0)

## 2013-12-30 LAB — HEMOGLOBIN A1C: HEMOGLOBIN A1C: 10.2 % — AB (ref 4.6–6.5)

## 2014-01-06 ENCOUNTER — Ambulatory Visit: Payer: BC Managed Care – PPO | Admitting: Family Medicine

## 2014-01-24 ENCOUNTER — Ambulatory Visit (INDEPENDENT_AMBULATORY_CARE_PROVIDER_SITE_OTHER): Payer: BC Managed Care – PPO | Admitting: Family Medicine

## 2014-01-24 VITALS — BP 136/77 | HR 77 | Temp 98.1°F | Ht 70.0 in | Wt 174.4 lb

## 2014-01-24 DIAGNOSIS — I10 Essential (primary) hypertension: Secondary | ICD-10-CM

## 2014-01-24 DIAGNOSIS — E1165 Type 2 diabetes mellitus with hyperglycemia: Secondary | ICD-10-CM

## 2014-01-24 DIAGNOSIS — IMO0002 Reserved for concepts with insufficient information to code with codable children: Secondary | ICD-10-CM

## 2014-01-24 DIAGNOSIS — Z0000 Encounter for general adult medical examination without abnormal findings: Secondary | ICD-10-CM

## 2014-01-24 DIAGNOSIS — E785 Hyperlipidemia, unspecified: Secondary | ICD-10-CM

## 2014-01-24 DIAGNOSIS — Z23 Encounter for immunization: Secondary | ICD-10-CM

## 2014-01-24 MED ORDER — METFORMIN HCL 1000 MG PO TABS: ORAL_TABLET | ORAL | Status: DC

## 2014-01-24 MED ORDER — INSULIN GLARGINE 100 UNIT/ML SOLOSTAR PEN: 60.0000 [IU] | PEN_INJECTOR | Freq: Every morning | SUBCUTANEOUS | Status: DC

## 2014-01-24 MED ORDER — "PEN NEEDLES 5/16"" 31G X 8 MM MISC": Status: AC

## 2014-01-24 NOTE — Patient Instructions (Signed)
Do 55 of Lantus every day. Increase the Metformin to 2 1/2 tablets daily If sugars running higher than 130 consistently and no sugars below 100 then increase Lantus to 57 units or call for furthe guidance  Basic Carbohydrate Counting for Diabetes Mellitus Carbohydrate counting is a method for keeping track of the amount of carbohydrates you eat. Eating carbohydrates naturally increases the level of sugar (glucose) in your blood, so it is important for you to know the amount that is okay for you to have in every meal. Carbohydrate counting helps keep the level of glucose in your blood within normal limits. The amount of carbohydrates allowed is different for every person. A dietitian can help you calculate the amount that is right for you. Once you know the amount of carbohydrates you can have, you can count the carbohydrates in the foods you want to eat. Carbohydrates are found in the following foods:  Grains, such as breads and cereals.  Dried beans and soy products.  Starchy vegetables, such as potatoes, peas, and corn.  Fruit and fruit juices.  Milk and yogurt.  Sweets and snack foods, such as cake, cookies, candy, chips, soft drinks, and fruit drinks. CARBOHYDRATE COUNTING There are two ways to count the carbohydrates in your food. You can use either of the methods or a combination of both. Reading the "Nutrition Facts" on [place] The "Nutrition Facts" is an area that is included on the labels of almost all packaged food and beverages in the [place]. It includes the serving size of that food or beverage and information about the nutrients in each serving of the food, including the grams (g) of carbohydrate per serving.  Decide the number of servings of this food or beverage that you will be able to eat or drink. Multiply that number of servings by the number of grams of carbohydrate that is listed on the label for that serving. The total will be the amount of carbohydrates you  will be having when you eat or drink this food or beverage. Learning Standard Serving Sizes of Food When you eat food that is not packaged or does not include "Nutrition Facts" on the label, you need to measure the servings in order to count the amount of carbohydrates.A serving of most carbohydrate-rich foods contains about 15 g of carbohydrates. The following list includes serving sizes of carbohydrate-rich foods that provide 15 g ofcarbohydrate per serving:   1 slice of bread (1 oz) or 1 six-inch tortilla.    of a hamburger bun or English muffin.  4-6 crackers.   cup unsweetened dry cereal.    cup hot cereal.   cup rice or pasta.    cup mashed potatoes or  of a large baked potato.  1 cup fresh fruit or one small piece of fruit.    cup canned or frozen fruit or fruit juice.  1 cup milk.   cup plain fat-free yogurt or yogurt sweetened with artificial sweeteners.   cup cooked dried beans or starchy vegetable, such as peas, corn, or potatoes.  Decide the number of standard-size servings that you will eat. Multiply that number of servings by 15 (the grams of carbohydrates in that serving). For example, if you eat 2 cups of strawberries, you will have eaten 2 servings and 30 g of carbohydrates (2 servings x 15 g = 30 g). For foods such as soups and casseroles, in which more than one food is mixed in, you will need to count the carbohydrates  in each food that is included. EXAMPLE OF CARBOHYDRATE COUNTING Sample Dinner  3 oz chicken breast.   cup of brown rice.   cup of corn.  1 cup milk.   1 cup strawberries with sugar-free whipped topping.  Carbohydrate Calculation Step 1: Identify the foods that contain carbohydrates:   Rice.   Corn.   Milk.   Strawberries. Step 2:Calculate the number of servings eaten of each:   2 servings of rice.   1 serving of corn.   1 serving of milk.   1 serving of strawberries. Step 3: Multiply each of those  number of servings by 15 g:   2 servings of rice x 15 g = 30 g.   1 serving of corn x 15 g = 15 g.   1 serving of milk x 15 g = 15 g.   1 serving of strawberries x 15 g = 15 g. Step 4: Add together all of the amounts to find the total grams of carbohydrates eaten: 30 g + 15 g + 15 g + 15 g = 75 g. Document Released: 04/04/2005 Document Revised: 08/19/2013 Document Reviewed: 03/01/2013 [facility] Patient Information 2015 [place], [place]. This information is not intended to replace advice given to you by your health care provider. Make sure you discuss any questions you have with your health care provider.

## 2014-01-24 NOTE — Progress Notes (Signed)
Pre visit review using our clinic review tool, if applicable. No additional management support is needed unless otherwise documented below in the visit note. 

## 2014-01-26 ENCOUNTER — Encounter: Payer: Self-pay | Admitting: Family Medicine

## 2014-01-26 NOTE — Assessment & Plan Note (Signed)
Tolerating statin, encouraged heart healthy diet, avoid trans fats, minimize simple carbs and saturated fats. Increase exercise as tolerated 

## 2014-01-26 NOTE — Assessment & Plan Note (Signed)
Well controlled, no changes to meds. Encouraged heart healthy diet such as the DASH diet and exercise as tolerated.  °

## 2014-01-26 NOTE — Assessment & Plan Note (Signed)
A1C up encouraged decrease carbohydrate intake, has been eating excessive fruit. Offered Nutrition consult but declines for now. Will increase Metformin to 2500 mg daily and increase lantus to 40 units then up by 2 units every 3 days as needed and as tolerated. Monitor sugars closely. Check tid with meals, qhs and as needed.

## 2014-01-26 NOTE — Assessment & Plan Note (Signed)
Given flu shot  today 

## 2014-01-26 NOTE — Progress Notes (Signed)
Patient ID: ASWAD WANDREY, male   DOB: 26-Feb-1951, 63 y.o.   MRN: [id number] Zachary Joyce [id number] 1950-11-27 01/26/2014      Progress Note-Follow Up  Subjective  Chief Complaint  Chief Complaint  Patient presents with  . Follow-up  . Injections    flu    HPI  Patient is a 63 year old male in today for routine medical care. In today with his wife. Feeling well. No recent illness. Acknowledges he has been eating a great deal of fruit lately. 3 peaches at a time. Notes some polyuria and polydipsia. Denies CP/palp/SOB/HA/congestion/fevers/GI c/o. Taking meds as prescribed  Past Medical History  Diagnosis Date  . Diabetes mellitus type II   . Hyperlipidemia   . Hypertension   . Personal history of colonic polyps 12/23/2012  . Preventative health care 07/07/2013  . Low back pain 07/07/2013    History reviewed. No pertinent past surgical history.  Family History  Problem Relation Age of Onset  . Diabetes Father   . Diabetes Brother   . Kidney disease Brother   . Heart disease Mother     MI  . Hyperlipidemia Mother   . Hypertension Mother   . Cancer Brother     lung, heavy smoker  . Lung cancer Brother     History   Social History  . Marital Status: Married    Spouse Name: N/A    Number of Children: N/A  . Years of Education: N/A   Occupational History  . Not on file.   Social History Main Topics  . Smoking status: Never Smoker   . Smokeless tobacco: Never Used  . Alcohol Use: No  . Drug Use: Not on file  . Sexual Activity: Yes     Comment: lives with wife, exercises, heart healthy diet   Other Topics Concern  . Not on file   Social History Narrative   Married   works at [employer]   Alcohol use-no     Tobacco use -no       Current Outpatient Prescriptions on File Prior to Visit  Medication Sig Dispense Refill  . Blood Glucose Monitoring Suppl (ONE TOUCH ULTRA SYSTEM KIT) W/DEVICE KIT 1 kit by Does not apply route once. Use to check blood sugar  twice a day-Dx Code 250.00       . calcium-vitamin D (OSCAL 500/200 D-3) 500-200 MG-UNIT per tablet Take 1 tablet by mouth daily.        . cyclobenzaprine (FLEXERIL) 10 MG tablet Take 1 tablet (10 mg total) by mouth 3 (three) times daily as needed for muscle spasms.  30 tablet  0  . glucose blood (ONE TOUCH ULTRA TEST) test strip USE AS DIRECTED TO TEST BLOOD GLUCOSE TWICE DAILY Dx: 250.00  200 each  5  . lisinopril-hydrochlorothiazide (PRINZIDE,ZESTORETIC) 20-12.5 MG per tablet TAKE ONE TABLET BY MOUTH ONCE DAILY  90 tablet  0  . Multiple Vitamin (MULTIVITAMIN) tablet Take 1 tablet by mouth daily.        . Omega-3 Fatty Acids (FISH OIL) 1000 MG CAPS Take 1,000 mg by mouth 2 (two) times daily.        Marland Kitchen oxyCODONE-acetaminophen (PERCOCET/ROXICET) 5-325 MG per tablet       . simvastatin (ZOCOR) 80 MG tablet Take 0.5 tablets (40 mg total) by mouth at bedtime.  90 tablet  1  . traMADol (ULTRAM) 50 MG tablet Take 1 tablet (50 mg total) by mouth every 8 (eight) hours as needed.  30 tablet  0   No current facility-administered medications on file prior to visit.    No Known Allergies  Review of Systems  Review of Systems  Constitutional: Negative for fever and malaise/fatigue.  HENT: Negative for congestion.   Eyes: Negative for pain and discharge.  Respiratory: Negative for shortness of breath.   Cardiovascular: Negative for chest pain, palpitations and leg swelling.  Gastrointestinal: Negative for nausea, abdominal pain and diarrhea.  Genitourinary: Negative for dysuria.  Musculoskeletal: Negative for falls.  Skin: Negative for rash.  Neurological: Negative for loss of consciousness and headaches.  Endo/Heme/Allergies: Negative for polydipsia.  Psychiatric/Behavioral: Negative for depression and suicidal ideas. The patient is not nervous/anxious and does not have insomnia.     Objective  BP 136/77  Pulse 77  Temp(Src) 98.1 F (36.7 C) (Oral)  Ht 5' 10"  (1.778 m)  Wt 174 lb 6.4 oz  (79.107 kg)  BMI 25.02 kg/m2  SpO2 100%  Physical Exam  Physical Exam  Constitutional: He is oriented to person, place, and time and well-developed, well-nourished, and in no distress. No distress.  HENT:  Head: Normocephalic and atraumatic.  Eyes: Conjunctivae are normal.  Neck: Neck supple. No thyromegaly present.  Cardiovascular: Normal rate, regular rhythm and normal heart sounds.   No murmur heard. Pulmonary/Chest: Effort normal and breath sounds normal. No respiratory distress.  Abdominal: He exhibits no distension and no mass. There is no tenderness.  Musculoskeletal: He exhibits no edema.  Neurological: He is alert and oriented to person, place, and time.  Skin: Skin is warm.  Psychiatric: Memory, affect and judgment normal.    Lab Results  Component Value Date   TSH 4.058 06/28/2013   Lab Results  Component Value Date   WBC 4.3 06/28/2013   HGB 16.9 06/28/2013   HCT 47.9 06/28/2013   MCV 93.9 06/28/2013   PLT 226 06/28/2013   Lab Results  Component Value Date   CREATININE 1.0 12/30/2013   BUN 16 12/30/2013   NA 137 12/30/2013   K 4.3 12/30/2013   CL 101 12/30/2013   CO2 28 12/30/2013   Lab Results  Component Value Date   ALT 26 06/28/2013   AST 26 06/28/2013   ALKPHOS 53 06/28/2013   BILITOT 0.8 06/28/2013   Lab Results  Component Value Date   CHOL 145 12/30/2013   Lab Results  Component Value Date   HDL 36.20* 12/30/2013   Lab Results  Component Value Date   LDLCALC 80 12/30/2013   Lab Results  Component Value Date   TRIG 144.0 12/30/2013   Lab Results  Component Value Date   CHOLHDL 4 12/30/2013     Assessment & Plan  Essential hypertension Well controlled, no changes to meds. Encouraged heart healthy diet such as the DASH diet and exercise as tolerated.   Diabetes mellitus type 2, uncontrolled A1C up encouraged decrease carbohydrate intake, has been eating excessive fruit. Offered Nutrition consult but declines for now. Will increase Metformin to  2500 mg daily and increase lantus to 40 units then up by 2 units every 3 days as needed and as tolerated. Monitor sugars closely. Check tid with meals, qhs and as needed.   Hyperlipidemia Tolerating statin, encouraged heart healthy diet, avoid trans fats, minimize simple carbs and saturated fats. Increase exercise as tolerated  Preventative health care Given flu shot today

## 2014-02-21 ENCOUNTER — Other Ambulatory Visit: Payer: Self-pay | Admitting: Family Medicine

## 2014-02-21 NOTE — Telephone Encounter (Signed)
Rx request to pharmacy/SLS  

## 2014-03-12 ENCOUNTER — Other Ambulatory Visit: Payer: Self-pay | Admitting: Family Medicine

## 2014-03-12 MED ORDER — LISINOPRIL-HYDROCHLOROTHIAZIDE 20-12.5 MG PO TABS: 1.0000 | ORAL_TABLET | Freq: Every day | ORAL | Status: DC

## 2014-03-12 NOTE — Telephone Encounter (Signed)
Metformin #225 with refill written in Oct Sent in [place]

## 2014-06-02 ENCOUNTER — Other Ambulatory Visit: Payer: BC Managed Care – PPO

## 2014-06-09 ENCOUNTER — Ambulatory Visit: Payer: BC Managed Care – PPO | Admitting: Family Medicine

## 2014-06-19 ENCOUNTER — Other Ambulatory Visit: Payer: Self-pay | Admitting: Family Medicine

## 2014-06-30 ENCOUNTER — Other Ambulatory Visit (INDEPENDENT_AMBULATORY_CARE_PROVIDER_SITE_OTHER): Payer: BC Managed Care – PPO

## 2014-06-30 DIAGNOSIS — IMO0002 Reserved for concepts with insufficient information to code with codable children: Secondary | ICD-10-CM

## 2014-06-30 DIAGNOSIS — E785 Hyperlipidemia, unspecified: Secondary | ICD-10-CM

## 2014-06-30 DIAGNOSIS — Z0000 Encounter for general adult medical examination without abnormal findings: Secondary | ICD-10-CM

## 2014-06-30 DIAGNOSIS — E1165 Type 2 diabetes mellitus with hyperglycemia: Secondary | ICD-10-CM

## 2014-06-30 DIAGNOSIS — I10 Essential (primary) hypertension: Secondary | ICD-10-CM

## 2014-06-30 LAB — CBC
HEMATOCRIT: 44.8 % (ref 39.0–52.0)
Hemoglobin: 15.2 g/dL (ref 13.0–17.0)
MCH: 32.5 pg (ref 26.0–34.0)
MCHC: 33.9 g/dL (ref 30.0–36.0)
MCV: 95.7 fL (ref 78.0–100.0)
MPV: 11.3 fL (ref 8.6–12.4)
PLATELETS: 186 10*3/uL (ref 150–400)
RBC: 4.68 MIL/uL (ref 4.22–5.81)
RDW: 13.9 % (ref 11.5–15.5)
WBC: 5.9 10*3/uL (ref 4.0–10.5)

## 2014-06-30 LAB — HEPATIC FUNCTION PANEL
ALT: 41 U/L (ref 0–53)
AST: 26 U/L (ref 0–37)
Albumin: 4.2 g/dL (ref 3.5–5.2)
Alkaline Phosphatase: 60 U/L (ref 39–117)
BILIRUBIN INDIRECT: 0.4 mg/dL (ref 0.2–1.2)
BILIRUBIN TOTAL: 0.5 mg/dL (ref 0.2–1.2)
Bilirubin, Direct: 0.1 mg/dL (ref 0.0–0.3)
TOTAL PROTEIN: 6.6 g/dL (ref 6.0–8.3)

## 2014-06-30 LAB — RENAL FUNCTION PANEL
Albumin: 4.2 g/dL (ref 3.5–5.2)
BUN: 16 mg/dL (ref 6–23)
CO2: 27 mEq/L (ref 19–32)
Calcium: 9.3 mg/dL (ref 8.4–10.5)
Chloride: 103 mEq/L (ref 96–112)
Creat: 0.98 mg/dL (ref 0.50–1.35)
Glucose, Bld: 177 mg/dL — ABNORMAL HIGH (ref 70–99)
PHOSPHORUS: 2.4 mg/dL (ref 2.3–4.6)
Potassium: 4.2 mEq/L (ref 3.5–5.3)
Sodium: 140 mEq/L (ref 135–145)

## 2014-06-30 LAB — HEMOGLOBIN A1C
Hgb A1c MFr Bld: 8.1 % — ABNORMAL HIGH (ref <5.7)
Mean Plasma Glucose: 186 mg/dL — ABNORMAL HIGH (ref <117)

## 2014-06-30 LAB — LIPID PANEL
Cholesterol: 145 mg/dL (ref 0–200)
HDL: 35 mg/dL — ABNORMAL LOW (ref >=40)
LDL Cholesterol: 51 mg/dL (ref 0–99)
TRIGLYCERIDES: 293 mg/dL — AB (ref <150)
Total CHOL/HDL Ratio: 4.1 Ratio
VLDL: 59 mg/dL — AB (ref 0–40)

## 2014-06-30 LAB — TSH: TSH: 1.704 u[IU]/mL (ref 0.350–4.500)

## 2014-07-01 LAB — PSA: PSA: 0.47 ng/mL (ref <=4.00)

## 2014-07-07 ENCOUNTER — Ambulatory Visit (INDEPENDENT_AMBULATORY_CARE_PROVIDER_SITE_OTHER): Payer: BC Managed Care – PPO | Admitting: Family Medicine

## 2014-07-07 ENCOUNTER — Encounter: Payer: Self-pay | Admitting: Family Medicine

## 2014-07-07 VITALS — BP 132/74 | HR 71 | Temp 98.8°F | Resp 18 | Ht 70.0 in | Wt 174.0 lb

## 2014-07-07 DIAGNOSIS — E785 Hyperlipidemia, unspecified: Secondary | ICD-10-CM | POA: Diagnosis not present

## 2014-07-07 DIAGNOSIS — M5440 Lumbago with sciatica, unspecified side: Secondary | ICD-10-CM

## 2014-07-07 DIAGNOSIS — IMO0002 Reserved for concepts with insufficient information to code with codable children: Secondary | ICD-10-CM

## 2014-07-07 DIAGNOSIS — E1165 Type 2 diabetes mellitus with hyperglycemia: Secondary | ICD-10-CM

## 2014-07-07 DIAGNOSIS — M545 Low back pain, unspecified: Secondary | ICD-10-CM

## 2014-07-07 DIAGNOSIS — I10 Essential (primary) hypertension: Secondary | ICD-10-CM | POA: Diagnosis not present

## 2014-07-07 DIAGNOSIS — Z1211 Encounter for screening for malignant neoplasm of colon: Secondary | ICD-10-CM | POA: Diagnosis not present

## 2014-07-07 NOTE — Progress Notes (Signed)
Zachary Joyce  [id number] [birth year] 07/07/2014      Progress Note-Follow Up  Subjective  Chief Complaint  Chief Complaint  Patient presents with  . Follow-up    HPI  Patient is a 64 y.o. male in today for routine medical care. Patient is in today with his wife and an interpreter. No recent illness or acute complaints. Has been exercising and trying to eat less carbs. Denies CP/palp/SOB/HA/congestion/fevers/GI or GU c/o. Taking meds as prescribed  Past Medical History  Diagnosis Date  . Diabetes mellitus type II   . Hyperlipidemia   . Hypertension   . Personal history of colonic polyps 12/23/2012  . Preventative health care 07/07/2013  . Low back pain 07/07/2013    History reviewed. No pertinent past surgical history.  Family History  Problem Relation Age of Onset  . Diabetes Father   . Diabetes Brother   . Kidney disease Brother   . Heart disease Mother     MI  . Hyperlipidemia Mother   . Hypertension Mother   . Cancer Brother     lung, heavy smoker  . Lung cancer Brother     History   Social History  . Marital Status: Married    Spouse Name: N/A  . Number of Children: N/A  . Years of Education: N/A   Occupational History  . Not on file.   Social History Main Topics  . Smoking status: Never Smoker   . Smokeless tobacco: Never Used  . Alcohol Use: No  . Drug Use: Not on file  . Sexual Activity: Yes     Comment: lives with wife, exercises, heart healthy diet   Other Topics Concern  . Not on file   Social History Narrative   Married   works at [employer]   Alcohol use-no     Tobacco use -no       Current Outpatient Prescriptions on File Prior to Visit  Medication Sig Dispense Refill  . Blood Glucose Monitoring Suppl (ONE TOUCH ULTRA SYSTEM KIT) W/DEVICE KIT 1 kit by Does not apply route once. Use to check blood sugar twice a day-Dx Code 250.00     . calcium-vitamin D (OSCAL 500/200 D-3) 500-200 MG-UNIT per tablet Take 1 tablet by mouth daily.       . cyclobenzaprine (FLEXERIL) 10 MG tablet Take 1 tablet (10 mg total) by mouth 3 (three) times daily as needed for muscle spasms. 30 tablet 0  . glucose blood (ONE TOUCH ULTRA TEST) test strip USE AS DIRECTED TO TEST BLOOD GLUCOSE TWICE DAILY DX: 250.00 100 each 5  . Insulin Glargine (LANTUS SOLOSTAR) 100 UNIT/ML Solostar Pen Inject 60 Units into the skin every morning. 90 mL 1  . Insulin Pen Needle (PEN NEEDLES 31GX5/16") 31G X 8 MM MISC Use once a day as directed 100 each 3  . lisinopril-hydrochlorothiazide (PRINZIDE,ZESTORETIC) 20-12.5 MG per tablet TAKE ONE TABLET BY MOUTH ONCE DAILY 90 tablet 0  . metFORMIN (GLUCOPHAGE) 1000 MG tablet 1 tab po bid and 1/2 tab po q noon 225 tablet 1  . Multiple Vitamin (MULTIVITAMIN) tablet Take 1 tablet by mouth daily.      . Omega-3 Fatty Acids (FISH OIL) 1000 MG CAPS Take 1,000 mg by mouth 2 (two) times daily.      Marland Kitchen oxyCODONE-acetaminophen (PERCOCET/ROXICET) 5-325 MG per tablet     . simvastatin (ZOCOR) 80 MG tablet Take 0.5 tablets (40 mg total) by mouth at bedtime. 90 tablet 1  . traMADol (  ULTRAM) 50 MG tablet Take 1 tablet (50 mg total) by mouth every 8 (eight) hours as needed. 30 tablet 0   No current facility-administered medications on file prior to visit.    No Known Allergies  Review of Systems  Review of Systems  Constitutional: Negative for fever and malaise/fatigue.  HENT: Negative for congestion.   Eyes: Negative for discharge.  Respiratory: Negative for shortness of breath.   Cardiovascular: Negative for chest pain, palpitations and leg swelling.  Gastrointestinal: Negative for nausea, abdominal pain and diarrhea.  Genitourinary: Negative for dysuria.  Musculoskeletal: Negative for falls.  Skin: Negative for rash.  Neurological: Negative for loss of consciousness and headaches.  Endo/Heme/Allergies: Negative for polydipsia.  Psychiatric/Behavioral: Negative for depression and suicidal ideas. The patient is not nervous/anxious  and does not have insomnia.     Objective  BP 132/74 mmHg  Pulse 71  Temp(Src) 98.8 F (37.1 C) (Oral)  Resp 18  Ht 5' 10"  (1.778 m)  Wt 174 lb (78.926 kg)  BMI 24.97 kg/m2  SpO2 100%  Physical Exam  Physical Exam  Constitutional: He is oriented to person, place, and time and well-developed, well-nourished, and in no distress. No distress.  HENT:  Head: Normocephalic and atraumatic.  Eyes: Conjunctivae are normal.  Neck: Neck supple. No thyromegaly present.  Cardiovascular: Normal rate, regular rhythm and normal heart sounds.   No murmur heard. Pulmonary/Chest: Effort normal and breath sounds normal. No respiratory distress.  Abdominal: He exhibits no distension and no mass. There is no tenderness.  Musculoskeletal: He exhibits no edema.  Neurological: He is alert and oriented to person, place, and time.  Skin: Skin is warm.  Psychiatric: Memory, affect and judgment normal.    Lab Results  Component Value Date   TSH 1.704 06/30/2014   Lab Results  Component Value Date   WBC 5.9 06/30/2014   HGB 15.2 06/30/2014   HCT 44.8 06/30/2014   MCV 95.7 06/30/2014   PLT 186 06/30/2014   Lab Results  Component Value Date   CREATININE 0.98 06/30/2014   BUN 16 06/30/2014   NA 140 06/30/2014   K 4.2 06/30/2014   CL 103 06/30/2014   CO2 27 06/30/2014   Lab Results  Component Value Date   ALT 41 06/30/2014   AST 26 06/30/2014   ALKPHOS 60 06/30/2014   BILITOT 0.5 06/30/2014   Lab Results  Component Value Date   CHOL 145 06/30/2014   Lab Results  Component Value Date   HDL 35* 06/30/2014   Lab Results  Component Value Date   LDLCALC 51 06/30/2014   Lab Results  Component Value Date   TRIG 293* 06/30/2014   Lab Results  Component Value Date   CHOLHDL 4.1 06/30/2014     Assessment & Plan  Essential hypertension Well controlled, no changes to meds. Encouraged heart healthy diet such as the DASH diet and exercise as tolerated.    Diabetes mellitus  type 2, uncontrolled hgba1c still unacceptable, minimize simple carbs. Increase exercise as tolerated. Continue current meds. Has been working hard at controlling sugars with diet and exercise. Will continue to monitor and may increase Lantus by 2 more units as needed and tolerated   Hyperlipidemia Tolerating statin, encouraged heart healthy diet, avoid trans fats, minimize simple carbs and saturated fats. Increase exercise as tolerated   Low back pain with radiation Doing better may use meds prn as infrequently as needed to manage severe pain

## 2014-07-07 NOTE — Patient Instructions (Signed)

## 2014-07-07 NOTE — Progress Notes (Signed)
Pre visit review using our clinic review tool, if applicable. No additional management support is needed unless otherwise documented below in the visit note. 

## 2014-07-17 NOTE — Assessment & Plan Note (Signed)
Well controlled, no changes to meds. Encouraged heart healthy diet such as the DASH diet and exercise as tolerated.  °

## 2014-07-17 NOTE — Assessment & Plan Note (Signed)
Tolerating statin, encouraged heart healthy diet, avoid trans fats, minimize simple carbs and saturated fats. Increase exercise as tolerated 

## 2014-07-17 NOTE — Assessment & Plan Note (Signed)
Doing better may use meds prn as infrequently as needed to manage severe pain

## 2014-07-17 NOTE — Assessment & Plan Note (Signed)
hgba1c still unacceptable, minimize simple carbs. Increase exercise as tolerated. Continue current meds. Has been working hard at controlling sugars with diet and exercise. Will continue to monitor and may increase Lantus by 2 more units as needed and tolerated

## 2014-07-26 ENCOUNTER — Other Ambulatory Visit: Payer: Self-pay | Admitting: Family Medicine

## 2014-08-14 ENCOUNTER — Encounter: Payer: Self-pay | Admitting: Family Medicine

## 2014-09-16 ENCOUNTER — Other Ambulatory Visit (INDEPENDENT_AMBULATORY_CARE_PROVIDER_SITE_OTHER): Payer: BC Managed Care – PPO

## 2014-09-16 DIAGNOSIS — I10 Essential (primary) hypertension: Secondary | ICD-10-CM

## 2014-09-16 DIAGNOSIS — E785 Hyperlipidemia, unspecified: Secondary | ICD-10-CM

## 2014-09-16 DIAGNOSIS — E1165 Type 2 diabetes mellitus with hyperglycemia: Secondary | ICD-10-CM | POA: Diagnosis not present

## 2014-09-16 DIAGNOSIS — IMO0002 Reserved for concepts with insufficient information to code with codable children: Secondary | ICD-10-CM

## 2014-09-16 LAB — HEMOGLOBIN A1C: Hgb A1c MFr Bld: 7.8 % — ABNORMAL HIGH (ref 4.6–6.5)

## 2014-09-16 LAB — LIPID PANEL
CHOL/HDL RATIO: 4
Cholesterol: 142 mg/dL (ref 0–200)
HDL: 39.9 mg/dL (ref >39.00)
NonHDL: 102.1
TRIGLYCERIDES: 232 mg/dL — AB (ref 0.0–149.0)
VLDL: 46.4 mg/dL — ABNORMAL HIGH (ref 0.0–40.0)

## 2014-09-16 LAB — COMPREHENSIVE METABOLIC PANEL
ALBUMIN: 4.2 g/dL (ref 3.5–5.2)
ALT: 28 U/L (ref 0–53)
AST: 23 U/L (ref 0–37)
Alkaline Phosphatase: 60 U/L (ref 39–117)
BILIRUBIN TOTAL: 0.4 mg/dL (ref 0.2–1.2)
BUN: 14 mg/dL (ref 6–23)
CO2: 30 meq/L (ref 19–32)
Calcium: 9.5 mg/dL (ref 8.4–10.5)
Chloride: 103 mEq/L (ref 96–112)
Creatinine, Ser: 1.1 mg/dL (ref 0.40–1.50)
GFR: 71.95 mL/min (ref >60.00)
GLUCOSE: 110 mg/dL — AB (ref 70–99)
Potassium: 4 mEq/L (ref 3.5–5.1)
Sodium: 139 mEq/L (ref 135–145)
Total Protein: 7.1 g/dL (ref 6.0–8.3)

## 2014-09-16 LAB — CBC
HCT: 44.6 % (ref 39.0–52.0)
Hemoglobin: 15 g/dL (ref 13.0–17.0)
MCHC: 33.7 g/dL (ref 30.0–36.0)
MCV: 96.7 fl (ref 78.0–100.0)
PLATELETS: 200 10*3/uL (ref 150.0–400.0)
RBC: 4.61 Mil/uL (ref 4.22–5.81)
RDW: 13.3 % (ref 11.5–15.5)
WBC: 7.5 10*3/uL (ref 4.0–10.5)

## 2014-09-16 LAB — LDL CHOLESTEROL, DIRECT: Direct LDL: 62 mg/dL

## 2014-09-16 LAB — TSH: TSH: 2.17 u[IU]/mL (ref 0.35–4.50)

## 2014-09-19 ENCOUNTER — Other Ambulatory Visit: Payer: Self-pay | Admitting: Family Medicine

## 2014-09-22 ENCOUNTER — Other Ambulatory Visit: Payer: BC Managed Care – PPO

## 2014-09-23 ENCOUNTER — Encounter: Payer: Self-pay | Admitting: Family Medicine

## 2014-09-23 ENCOUNTER — Ambulatory Visit (INDEPENDENT_AMBULATORY_CARE_PROVIDER_SITE_OTHER): Payer: BC Managed Care – PPO | Admitting: Family Medicine

## 2014-09-23 VITALS — BP 112/68 | HR 84 | Temp 98.3°F | Ht 70.0 in | Wt 166.0 lb

## 2014-09-23 DIAGNOSIS — E785 Hyperlipidemia, unspecified: Secondary | ICD-10-CM | POA: Diagnosis not present

## 2014-09-23 DIAGNOSIS — E1165 Type 2 diabetes mellitus with hyperglycemia: Secondary | ICD-10-CM

## 2014-09-23 DIAGNOSIS — IMO0002 Reserved for concepts with insufficient information to code with codable children: Secondary | ICD-10-CM

## 2014-09-23 DIAGNOSIS — I10 Essential (primary) hypertension: Secondary | ICD-10-CM

## 2014-09-23 NOTE — Progress Notes (Signed)
Pre visit review using our clinic review tool, if applicable. No additional management support is needed unless otherwise documented below in the visit note. 

## 2014-09-23 NOTE — Progress Notes (Signed)
Zachary Joyce  [id number] 11-Jan-1951 09/23/2014      Progress Note-Follow Up  Subjective  Chief Complaint  Chief Complaint  Patient presents with  . Follow-up    3 month    HPI  Patient is a 64 y.o. male in today for routine medical care. Patient is in today for follow-up accompanied by his wife. He is feeling well. No recent illness. Continues to have blood sugars as high as 210 but as low as 70. His blood sugar this morning was 124. He denies polyuria or polydipsia. No recent illness. Denies CP/palp/SOB/HA/congestion/fevers/GI or GU c/o. Taking meds as prescribed  Past Medical History  Diagnosis Date  . Diabetes mellitus type II   . Hyperlipidemia   . Hypertension   . Personal history of colonic polyps 12/23/2012  . Preventative health care 07/07/2013  . Low back pain 07/07/2013    History reviewed. No pertinent past surgical history.  Family History  Problem Relation Age of Onset  . Diabetes Father   . Diabetes Brother   . Kidney disease Brother   . Heart disease Mother     MI  . Hyperlipidemia Mother   . Hypertension Mother   . Cancer Brother     lung, heavy smoker  . Lung cancer Brother     History   Social History  . Marital Status: Married    Spouse Name: N/A  . Number of Children: N/A  . Years of Education: N/A   Occupational History  . Not on file.   Social History Main Topics  . Smoking status: Never Smoker   . Smokeless tobacco: Never Used  . Alcohol Use: No  . Drug Use: Not on file  . Sexual Activity: Yes     Comment: lives with wife, exercises, heart healthy diet   Other Topics Concern  . Not on file   Social History Narrative   Married   works at [employer]   Alcohol use-no     Tobacco use -no       Current Outpatient Prescriptions on File Prior to Visit  Medication Sig Dispense Refill  . Blood Glucose Monitoring Suppl (ONE TOUCH ULTRA SYSTEM KIT) W/DEVICE KIT 1 kit by Does not apply route once. Use to check blood sugar twice a  day-Dx Code 250.00     . calcium-vitamin D (OSCAL 500/200 D-3) 500-200 MG-UNIT per tablet Take 1 tablet by mouth daily.      . cyclobenzaprine (FLEXERIL) 10 MG tablet Take 1 tablet (10 mg total) by mouth 3 (three) times daily as needed for muscle spasms. 30 tablet 0  . glucose blood (ONE TOUCH ULTRA TEST) test strip USE AS DIRECTED TO TEST BLOOD GLUCOSE TWICE DAILY DX: 250.00 100 each 5  . Insulin Glargine (LANTUS SOLOSTAR) 100 UNIT/ML Solostar Pen Inject 60 Units into the skin every morning. 90 mL 1  . Insulin Pen Needle (PEN NEEDLES 31GX5/16") 31G X 8 MM MISC Use once a day as directed 100 each 3  . lisinopril-hydrochlorothiazide (PRINZIDE,ZESTORETIC) 20-12.5 MG per tablet TAKE ONE TABLET BY MOUTH ONCE DAILY 90 tablet 0  . metFORMIN (GLUCOPHAGE) 1000 MG tablet 1 tab po bid and 1/2 tab po q noon 225 tablet 1  . Multiple Vitamin (MULTIVITAMIN) tablet Take 1 tablet by mouth daily.      . Omega-3 Fatty Acids (FISH OIL) 1000 MG CAPS Take 1,000 mg by mouth 2 (two) times daily.      Marland Kitchen oxyCODONE-acetaminophen (PERCOCET/ROXICET) 5-325 MG per tablet     .  simvastatin (ZOCOR) 80 MG tablet Take 0.5 tablets (40 mg total) by mouth at bedtime. 45 tablet 1  . traMADol (ULTRAM) 50 MG tablet Take 1 tablet (50 mg total) by mouth every 8 (eight) hours as needed. 30 tablet 0   No current facility-administered medications on file prior to visit.    No Known Allergies  Review of Systems  Review of Systems  Constitutional: Negative for fever and malaise/fatigue.  HENT: Negative for congestion.   Eyes: Negative for discharge.  Respiratory: Negative for shortness of breath.   Cardiovascular: Negative for chest pain, palpitations and leg swelling.  Gastrointestinal: Negative for nausea, abdominal pain and diarrhea.  Genitourinary: Negative for dysuria.  Musculoskeletal: Negative for falls.  Skin: Negative for rash.  Neurological: Negative for loss of consciousness and headaches.  Endo/Heme/Allergies: Negative  for polydipsia.  Psychiatric/Behavioral: Negative for depression and suicidal ideas. The patient is not nervous/anxious and does not have insomnia.     Objective  BP 112/68 mmHg  Pulse 84  Temp(Src) 98.3 F (36.8 C) (Oral)  Ht _0  (1.778 m)  Wt 166 lb (75.297 kg)  BMI 23.82 kg/m2  SpO2 100%  Physical Exam  Physical Exam  Constitutional: He is oriented to person, place, and time and well-developed, well-nourished, and in no distress. No distress.  HENT:  Head: Normocephalic and atraumatic.  Eyes: Conjunctivae are normal.  Neck: Neck supple. No thyromegaly present.  Cardiovascular: Normal rate, regular rhythm and normal heart sounds.   No murmur heard. Pulmonary/Chest: Effort normal and breath sounds normal. No respiratory distress.  Abdominal: He exhibits no distension and no mass. There is no tenderness.  Musculoskeletal: He exhibits no edema.  Neurological: He is alert and oriented to person, place, and time.  Skin: Skin is warm.  Psychiatric: Memory, affect and judgment normal.    Lab Results  Component Value Date   TSH 2.17 09/16/2014   Lab Results  Component Value Date   WBC 7.5 09/16/2014   HGB 15.0 09/16/2014   HCT 44.6 09/16/2014   MCV 96.7 09/16/2014   PLT 200.0 09/16/2014   Lab Results  Component Value Date   CREATININE 1.10 09/16/2014   BUN 14 09/16/2014   NA 139 09/16/2014   K 4.0 09/16/2014   CL 103 09/16/2014   CO2 30 09/16/2014   Lab Results  Component Value Date   ALT 28 09/16/2014   AST 23 09/16/2014   ALKPHOS 60 09/16/2014   BILITOT 0.4 09/16/2014   Lab Results  Component Value Date   CHOL 142 09/16/2014   Lab Results  Component Value Date   HDL 39.90 09/16/2014   Lab Results  Component Value Date   LDLCALC 51 06/30/2014   Lab Results  Component Value Date   TRIG 232.0* 09/16/2014   Lab Results  Component Value Date   CHOLHDL 4 09/16/2014     Assessment & Plan  Essential hypertension Well controlled, no changes  to meds. Encouraged heart healthy diet such as the DASH diet and exercise as tolerated.   Hyperlipidemia Tolerating statin, encouraged heart healthy diet, avoid trans fats, minimize simple carbs and saturated fats. Increase exercise as tolerated  Diabetes mellitus type 2, uncontrolled hgba1c acceptable, minimize simple carbs. Increase exercise as tolerated. Continue current meds

## 2014-09-23 NOTE — Patient Instructions (Signed)

## 2014-09-30 ENCOUNTER — Ambulatory Visit: Payer: BC Managed Care – PPO | Admitting: Family Medicine

## 2014-10-04 ENCOUNTER — Encounter: Payer: Self-pay | Admitting: Family Medicine

## 2014-10-04 NOTE — Assessment & Plan Note (Signed)
Well controlled, no changes to meds. Encouraged heart healthy diet such as the DASH diet and exercise as tolerated.  °

## 2014-10-04 NOTE — Assessment & Plan Note (Signed)
hgba1c acceptable, minimize simple carbs. Increase exercise as tolerated. Continue current meds 

## 2014-10-04 NOTE — Assessment & Plan Note (Signed)
Tolerating statin, encouraged heart healthy diet, avoid trans fats, minimize simple carbs and saturated fats. Increase exercise as tolerated 

## 2014-10-21 ENCOUNTER — Encounter: Payer: Self-pay | Admitting: Gastroenterology

## 2014-12-07 ENCOUNTER — Other Ambulatory Visit: Payer: Self-pay | Admitting: Family Medicine

## 2014-12-09 ENCOUNTER — Encounter: Payer: BC Managed Care – PPO | Admitting: Gastroenterology

## 2014-12-17 ENCOUNTER — Other Ambulatory Visit: Payer: Self-pay | Admitting: Family Medicine

## 2014-12-19 ENCOUNTER — Ambulatory Visit (AMBULATORY_SURGERY_CENTER): Payer: Self-pay

## 2014-12-19 VITALS — Ht 70.0 in | Wt 171.0 lb

## 2014-12-19 DIAGNOSIS — Z1211 Encounter for screening for malignant neoplasm of colon: Secondary | ICD-10-CM

## 2014-12-19 MED ORDER — NA SULFATE-K SULFATE-MG SULF 17.5-3.13-1.6 GM/177ML PO SOLN: 1.0000 | Freq: Once | ORAL | Status: DC

## 2014-12-19 NOTE — Progress Notes (Signed)
No egg or soy allergy.  No previous complications from anesthesia. No home O2. No diet meds. 

## 2015-01-02 ENCOUNTER — Encounter: Payer: Self-pay | Admitting: Gastroenterology

## 2015-01-02 ENCOUNTER — Ambulatory Visit (AMBULATORY_SURGERY_CENTER): Payer: BC Managed Care – PPO | Admitting: Gastroenterology

## 2015-01-02 VITALS — BP 112/73 | HR 65 | Temp 96.4°F | Resp 30 | Ht 70.0 in | Wt 166.0 lb

## 2015-01-02 DIAGNOSIS — Z1211 Encounter for screening for malignant neoplasm of colon: Secondary | ICD-10-CM

## 2015-01-02 DIAGNOSIS — D122 Benign neoplasm of ascending colon: Secondary | ICD-10-CM | POA: Diagnosis not present

## 2015-01-02 DIAGNOSIS — D125 Benign neoplasm of sigmoid colon: Secondary | ICD-10-CM

## 2015-01-02 MED ORDER — SODIUM CHLORIDE 0.9 % IV SOLN: 500.0000 mL | INTRAVENOUS | Status: DC

## 2015-01-02 NOTE — Progress Notes (Signed)
To recovery, report to Hodges, RN, VSS 

## 2015-01-02 NOTE — Progress Notes (Signed)
Called to room to assist during endoscopic procedure.  Patient ID and intended procedure confirmed with present staff. Received instructions for my participation in the procedure from the performing physician.  

## 2015-01-02 NOTE — Patient Instructions (Signed)
YOU HAD AN ENDOSCOPIC PROCEDURE TODAY AT [facility]:   Refer to the procedure report that was given to you for any specific questions about what was found during the examination.  If the procedure report does not answer your questions, please call your gastroenterologist to clarify.  If you requested that your care partner not be given the details of your procedure findings, then the procedure report has been included in a sealed envelope for you to review at your convenience later.  YOU SHOULD EXPECT: Some feelings of bloating in the abdomen. Passage of more gas than usual.  Walking can help get rid of the air that was put into your GI tract during the procedure and reduce the bloating. If you had a lower endoscopy (such as a colonoscopy or flexible sigmoidoscopy) you may notice spotting of blood in your stool or on the toilet paper. If you underwent a bowel prep for your procedure, you may not have a normal bowel movement for a few days.  Please Note:  You might notice some irritation and congestion in your nose or some drainage.  This is from the oxygen used during your procedure.  There is no need for concern and it should clear up in a day or so.  SYMPTOMS TO REPORT IMMEDIATELY:   Following lower endoscopy (colonoscopy or flexible sigmoidoscopy):  Excessive amounts of blood in the stool  Significant tenderness or worsening of abdominal pains  Swelling of the abdomen that is new, acute  Fever of 100F or higher   For urgent or emergent issues, a gastroenterologist can be reached at any hour by calling [phone].   DIET: Your first meal following the procedure should be a small meal and then it is ok to progress to your normal diet. Heavy or fried foods are harder to digest and may make you feel nauseous or bloated.  Likewise, meals heavy in dairy and vegetables can increase bloating.  Drink plenty of fluids but you should avoid alcoholic beverages for 24  hours.  ACTIVITY:  You should plan to take it easy for the rest of today and you should NOT DRIVE or use heavy machinery until tomorrow (because of the sedation medicines used during the test).    FOLLOW UP: Our staff will call the number listed on your records the next business day following your procedure to check on you and address any questions or concerns that you may have regarding the information given to you following your procedure. If we do not reach you, we will leave a message.  However, if you are feeling well and you are not experiencing any problems, there is no need to return our call.  We will assume that you have returned to your regular daily activities without incident.  If any biopsies were taken you will be contacted by phone or by letter within the next 1-3 weeks.  Please call us at [phone] if you have not heard about the biopsies in 3 weeks.    SIGNATURES/CONFIDENTIALITY: You and/or your care partner have signed paperwork which will be entered into your electronic medical record.  These signatures attest to the fact that that the information above on your After Visit Summary has been reviewed and is understood.  Full responsibility of the confidentiality of this discharge information lies with you and/or your care-partner.  Please, read all of the information given to you by your recovery room nurse.

## 2015-01-02 NOTE — Op Note (Signed)
[place]  [address] [place], [id number]   COLONOSCOPY PROCEDURE REPORT  PATIENT: Zachary, Joyce  MR#: [id number] BIRTHDATE: Jun 13, 1950 , 31  yrs. old GENDER: male ENDOSCOPIST: Inda Castle, MD REFERRED [id number] Charlett Blake, M.D. PROCEDURE DATE:  01/02/2015 PROCEDURE:   Colonoscopy, surveillance , Colonoscopy with cold biopsy polypectomy, and Colonoscopy with snare polypectomy First Screening Colonoscopy - Avg.  risk and is 50 yrs.  old or older - No.  Prior Negative Screening - Now for repeat screening. 10 or more years since last screening  History of Adenoma - Now for follow-up colonoscopy & has been > or = to 3 yrs.  N/A  Polyps removed today? Yes ASA CLASS:   Class II INDICATIONS:Colorectal Neoplasm Risk Assessment for this procedure is average risk. MEDICATIONS: Monitored anesthesia care and Propofol 240 mg IV  DESCRIPTION OF PROCEDURE:   After the risks benefits and alternatives of the procedure were thoroughly explained, informed consent was obtained.  The digital rectal exam revealed no abnormalities of the rectum.   The LB [id number]  endoscope was introduced through the anus and advanced to the cecum, which was identified by both the appendix and ileocecal valve. No adverse events experienced.   The quality of the prep was (Suprep was used) excellent.  The instrument was then slowly withdrawn as the colon was fully examined. Estimated blood loss is zero unless otherwise noted in this procedure report.      COLON FINDINGS: A sessile polyp measuring 3 mm in size was found in the ascending colon.  A polypectomy was performed with a cold snare.  The resection was complete, the polyp tissue was completely retrieved and sent to histology.   A sessile polyp measuring 2 mm in size was found in the ascending colon.  A polypectomy was performed with cold forceps.   A sessile polyp measuring 3 mm in size was found in the sigmoid colon.  A polypectomy  was performed with a cold snare.  Retroflexed views revealed no abnormalities. The time to cecum = 4.1 Withdrawal time = 10.9   The scope was withdrawn and the procedure completed. COMPLICATIONS: There were no immediate complications.  ENDOSCOPIC IMPRESSION: 1.   Sessile polyp was found in the ascending colon; polypectomy was performed with a cold snare 2.   Sessile polyp was found in the ascending colon; polypectomy was performed with cold forceps 3.   Sessile polyp was found in the sigmoid colon; polypectomy was performed with a cold snare  RECOMMENDATIONS: If all the polyp(s) removed today are proven to be adenomatous (pre-cancerous) polyps, you will need a colonoscopy in 3 in 5-10 years, depending upon pathology results.   You will receive a letter within 1-2 weeks with the results of your biopsy as well as final recommendations.  Please call my office if you have not received a letter after 3 weeks.  eSigned:  Inda Castle, MD 01/02/2015 8:10 AM   cc:   PATIENT NAME:  Zachary, Joyce MR#: [id number]

## 2015-01-05 ENCOUNTER — Telehealth: Payer: Self-pay

## 2015-01-05 NOTE — Telephone Encounter (Signed)
  Follow up Call-  Call back number 01/02/2015  Post procedure Call Back phone  # [phone]  Permission to leave phone message Yes     Patient questions:  Do you have a fever, pain , or abdominal swelling? No. Pain Score  0 *  Have you tolerated food without any problems? Yes.    Have you been able to return to your normal activities? Yes.    Do you have any questions about your discharge instructions: Diet   No. Medications  No. Follow up visit  No.  Do you have questions or concerns about your Care? No.  Actions: * If pain score is 4 or above: No action needed, pain <4.

## 2015-01-06 ENCOUNTER — Encounter: Payer: Self-pay | Admitting: Gastroenterology

## 2015-01-13 ENCOUNTER — Ambulatory Visit (INDEPENDENT_AMBULATORY_CARE_PROVIDER_SITE_OTHER): Payer: BC Managed Care – PPO

## 2015-01-13 DIAGNOSIS — Z23 Encounter for immunization: Secondary | ICD-10-CM

## 2015-01-21 LAB — HM DIABETES EYE EXAM

## 2015-01-26 ENCOUNTER — Encounter: Payer: Self-pay | Admitting: Family Medicine

## 2015-02-10 ENCOUNTER — Other Ambulatory Visit: Payer: Self-pay | Admitting: Family Medicine

## 2015-02-23 ENCOUNTER — Other Ambulatory Visit (INDEPENDENT_AMBULATORY_CARE_PROVIDER_SITE_OTHER): Payer: BC Managed Care – PPO

## 2015-02-23 DIAGNOSIS — E1165 Type 2 diabetes mellitus with hyperglycemia: Secondary | ICD-10-CM | POA: Diagnosis not present

## 2015-02-23 DIAGNOSIS — I10 Essential (primary) hypertension: Secondary | ICD-10-CM

## 2015-02-23 DIAGNOSIS — E785 Hyperlipidemia, unspecified: Secondary | ICD-10-CM | POA: Diagnosis not present

## 2015-02-23 DIAGNOSIS — IMO0002 Reserved for concepts with insufficient information to code with codable children: Secondary | ICD-10-CM

## 2015-02-23 LAB — HEMOGLOBIN A1C: HEMOGLOBIN A1C: 7.7 % — AB (ref 4.6–6.5)

## 2015-02-23 LAB — CBC
HEMATOCRIT: 45.3 % (ref 39.0–52.0)
Hemoglobin: 15.3 g/dL (ref 13.0–17.0)
MCHC: 33.8 g/dL (ref 30.0–36.0)
MCV: 98.2 fl (ref 78.0–100.0)
PLATELETS: 176 10*3/uL (ref 150.0–400.0)
RBC: 4.62 Mil/uL (ref 4.22–5.81)
RDW: 13.2 % (ref 11.5–15.5)
WBC: 6.6 10*3/uL (ref 4.0–10.5)

## 2015-02-23 LAB — COMPREHENSIVE METABOLIC PANEL
ALK PHOS: 71 U/L (ref 39–117)
ALT: 31 U/L (ref 0–53)
AST: 22 U/L (ref 0–37)
Albumin: 4.4 g/dL (ref 3.5–5.2)
BILIRUBIN TOTAL: 0.6 mg/dL (ref 0.2–1.2)
BUN: 23 mg/dL (ref 6–23)
CO2: 31 mEq/L (ref 19–32)
CREATININE: 1.01 mg/dL (ref 0.40–1.50)
Calcium: 10.4 mg/dL (ref 8.4–10.5)
Chloride: 100 mEq/L (ref 96–112)
GFR: 79.28 mL/min (ref >60.00)
GLUCOSE: 189 mg/dL — AB (ref 70–99)
Potassium: 3.9 mEq/L (ref 3.5–5.1)
SODIUM: 138 meq/L (ref 135–145)
TOTAL PROTEIN: 7.1 g/dL (ref 6.0–8.3)

## 2015-02-23 LAB — LIPID PANEL
CHOLESTEROL: 161 mg/dL (ref 0–200)
HDL: 36.3 mg/dL — ABNORMAL LOW (ref >39.00)
NonHDL: 125.06
Total CHOL/HDL Ratio: 4
Triglycerides: 304 mg/dL — ABNORMAL HIGH (ref 0.0–149.0)
VLDL: 60.8 mg/dL — AB (ref 0.0–40.0)

## 2015-02-23 LAB — TSH: TSH: 1.18 u[IU]/mL (ref 0.35–4.50)

## 2015-02-23 LAB — LDL CHOLESTEROL, DIRECT: LDL DIRECT: 81 mg/dL

## 2015-02-27 IMAGING — CR DG LUMBAR SPINE COMPLETE 4+V
5 series · 5 of 5 positions shown · non-contrast
Comparison: None.

CLINICAL DATA: Low back pain with intermittent radicular symptoms

EXAM:
LUMBAR SPINE - COMPLETE 4+ VIEW

[t l-spine a.p.]
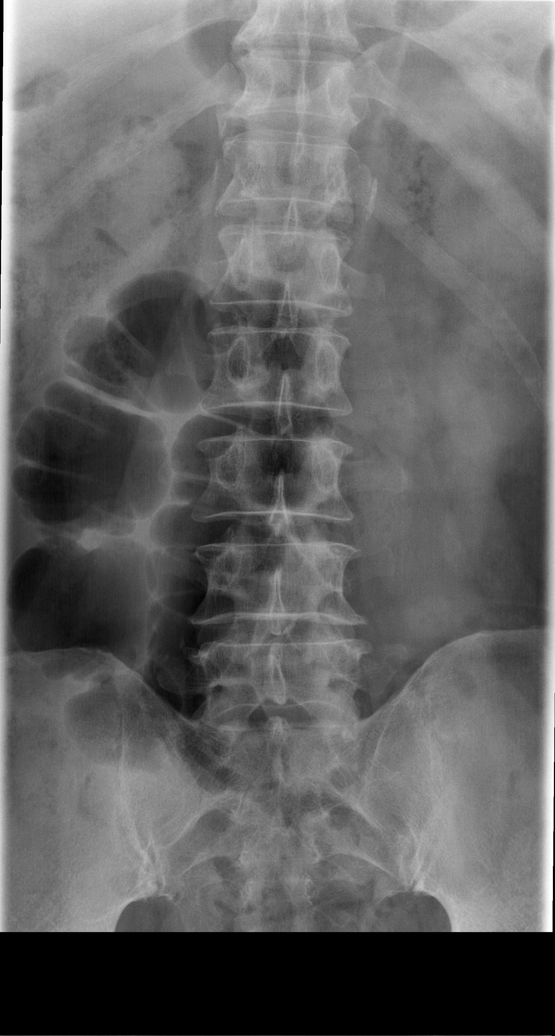

[t l-spine oblique exposure (1 of 2)]
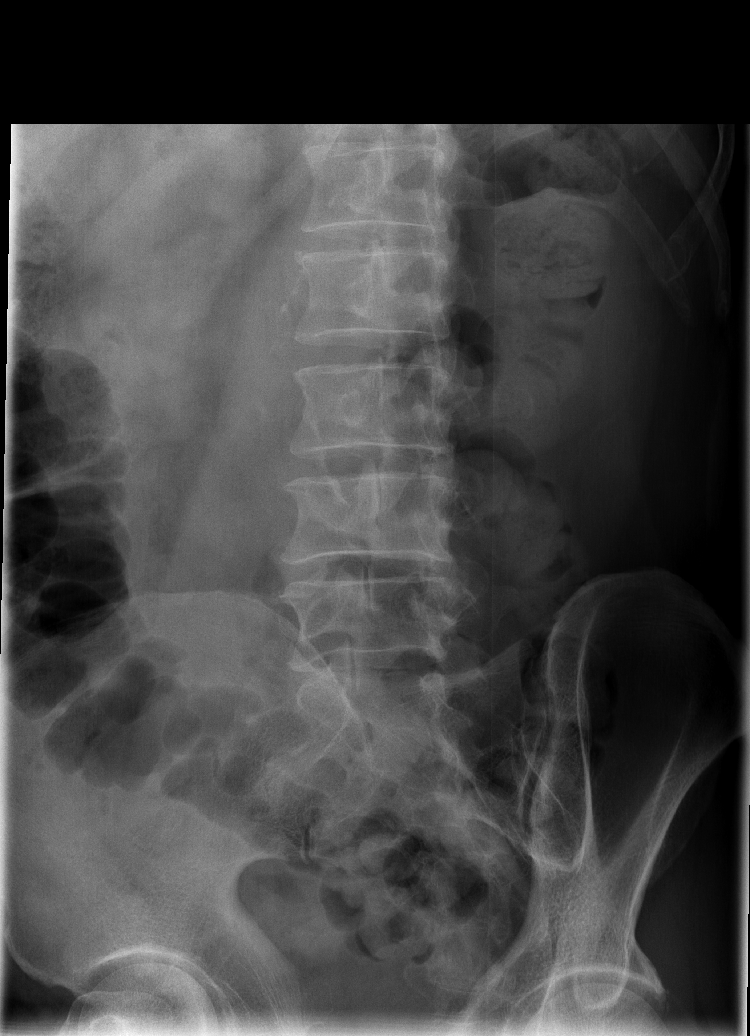

[t l-spine oblique exposure (2 of 2)]
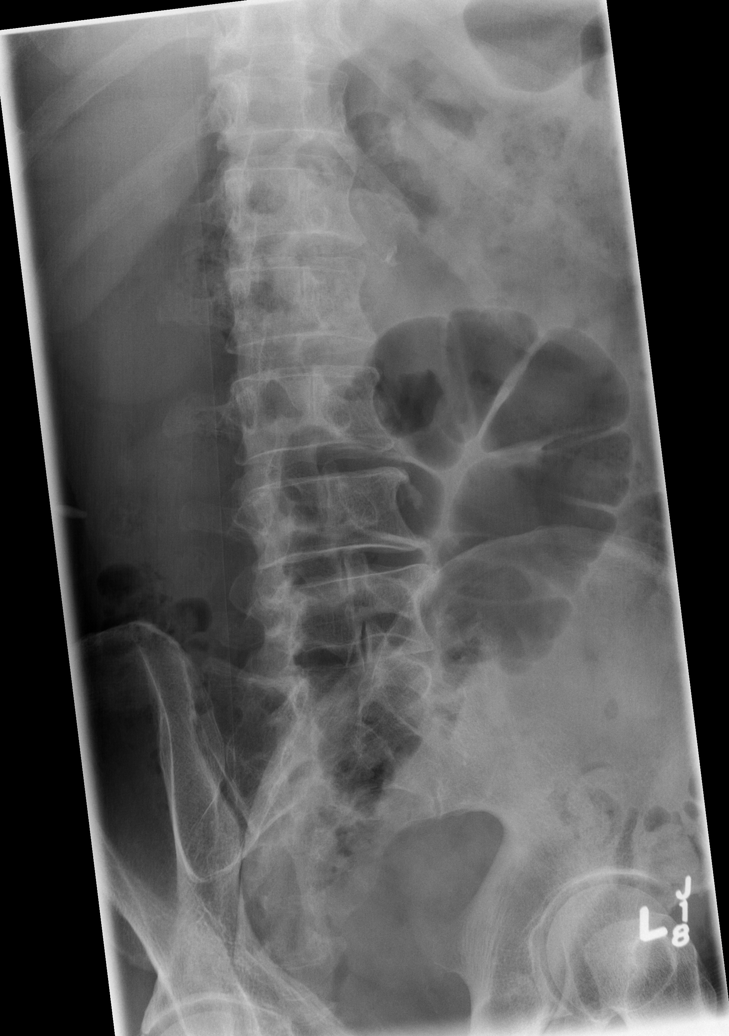

[t l-spine lat]
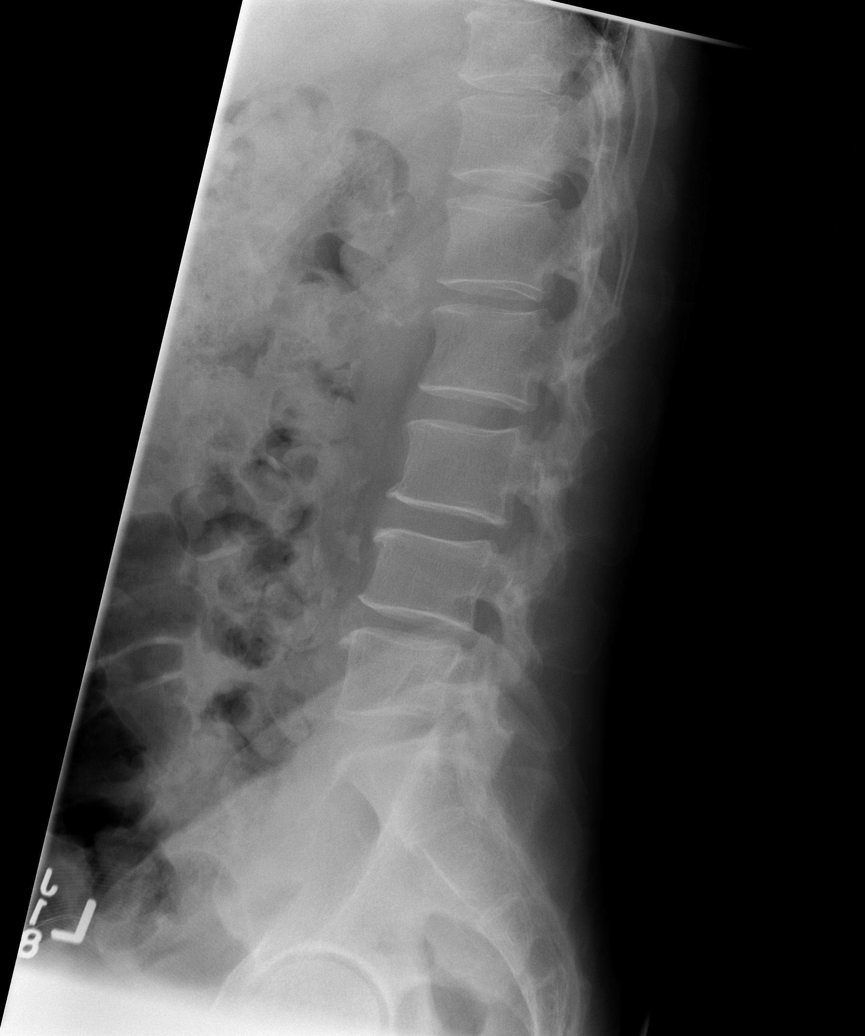

[t l-spine l5-s1 spot]
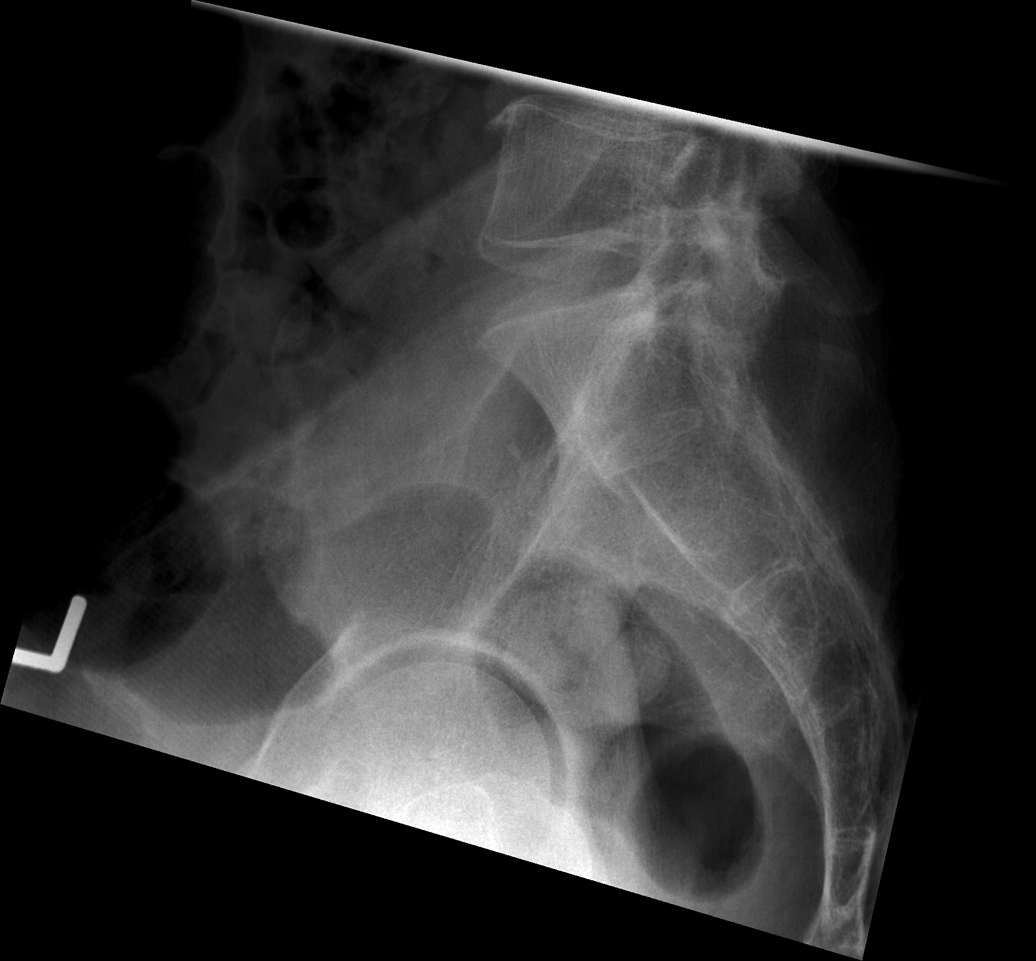

[5 of 5 positions shown; findings below may reference images not displayed]

FINDINGS: Frontal, lateral, spot lumbosacral lateral, and bilateral oblique
views were obtained. There are 5 non-rib-bearing lumbar type
vertebral bodies. There is no fracture or spondylolisthesis. There
is moderate disc space narrowing at L4-5. Other disc spaces appear
intact. There is no appreciable facet arthropathy.
IMPRESSION: Disc space narrowing at L4-5.  No fracture or spondylolisthesis.

## 2015-03-02 ENCOUNTER — Ambulatory Visit: Payer: BC Managed Care – PPO | Admitting: Family Medicine

## 2015-03-04 ENCOUNTER — Encounter: Payer: Self-pay | Admitting: Family Medicine

## 2015-03-04 ENCOUNTER — Ambulatory Visit (INDEPENDENT_AMBULATORY_CARE_PROVIDER_SITE_OTHER): Payer: BC Managed Care – PPO | Admitting: Family Medicine

## 2015-03-04 VITALS — BP 132/78 | HR 81 | Temp 98.0°F | Resp 16 | Wt 174.0 lb

## 2015-03-04 DIAGNOSIS — E1165 Type 2 diabetes mellitus with hyperglycemia: Secondary | ICD-10-CM

## 2015-03-04 DIAGNOSIS — I10 Essential (primary) hypertension: Secondary | ICD-10-CM | POA: Diagnosis not present

## 2015-03-04 DIAGNOSIS — R251 Tremor, unspecified: Secondary | ICD-10-CM | POA: Diagnosis not present

## 2015-03-04 DIAGNOSIS — E118 Type 2 diabetes mellitus with unspecified complications: Secondary | ICD-10-CM | POA: Diagnosis not present

## 2015-03-04 DIAGNOSIS — E785 Hyperlipidemia, unspecified: Secondary | ICD-10-CM | POA: Diagnosis not present

## 2015-03-04 DIAGNOSIS — IMO0002 Reserved for concepts with insufficient information to code with codable children: Secondary | ICD-10-CM

## 2015-03-04 HISTORY — DX: Tremor, unspecified: R25.1

## 2015-03-04 MED ORDER — INSULIN GLARGINE 100 UNIT/ML SOLOSTAR PEN: 60.0000 [IU] | PEN_INJECTOR | Freq: Every morning | SUBCUTANEOUS | Status: DC

## 2015-03-04 MED ORDER — LISINOPRIL-HYDROCHLOROTHIAZIDE 20-12.5 MG PO TABS: 1.0000 | ORAL_TABLET | Freq: Every day | ORAL | Status: AC

## 2015-03-04 MED ORDER — METOPROLOL SUCCINATE ER 25 MG PO TB24: 25.0000 mg | ORAL_TABLET | Freq: Every day | ORAL | Status: AC

## 2015-03-04 MED ORDER — METFORMIN HCL 1000 MG PO TABS: ORAL_TABLET | ORAL | Status: AC

## 2015-03-04 NOTE — Patient Instructions (Signed)
Metamucil/Psyllium powder  Benefiber is another option will help with cholesterol and sugar and keep the bowels healthyBasic Carbohydrate Counting for Diabetes Mellitus Carbohydrate counting is a method for keeping track of the amount of carbohydrates you eat. Eating carbohydrates naturally increases the level of sugar (glucose) in your blood, so it is important for you to know the amount that is okay for you to have in every meal. Carbohydrate counting helps keep the level of glucose in your blood within normal limits. The amount of carbohydrates allowed is different for every person. A dietitian can help you calculate the amount that is right for you. Once you know the amount of carbohydrates you can have, you can count the carbohydrates in the foods you want to eat. Carbohydrates are found in the following foods:  Grains, such as breads and cereals.  Dried beans and soy products.  Starchy vegetables, such as potatoes, peas, and corn.  Fruit and fruit juices.  Milk and yogurt.  Sweets and snack foods, such as cake, cookies, candy, chips, soft drinks, and fruit drinks. CARBOHYDRATE COUNTING There are two ways to count the carbohydrates in your food. You can use either of the methods or a combination of both. Reading the "Nutrition Facts" on [place] The "Nutrition Facts" is an area that is included on the labels of almost all packaged food and beverages in the [place]. It includes the serving size of that food or beverage and information about the nutrients in each serving of the food, including the grams (g) of carbohydrate per serving.  Decide the number of servings of this food or beverage that you will be able to eat or drink. Multiply that number of servings by the number of grams of carbohydrate that is listed on the label for that serving. The total will be the amount of carbohydrates you will be having when you eat or drink this food or beverage. Learning Standard Serving  Sizes of Food When you eat food that is not packaged or does not include "Nutrition Facts" on the label, you need to measure the servings in order to count the amount of carbohydrates.A serving of most carbohydrate-rich foods contains about 15 g of carbohydrates. The following list includes serving sizes of carbohydrate-rich foods that provide 15 g ofcarbohydrate per serving:   1 slice of bread (1 oz) or 1 six-inch tortilla.    of a hamburger bun or English muffin.  4-6 crackers.   cup unsweetened dry cereal.    cup hot cereal.   cup rice or pasta.    cup mashed potatoes or  of a large baked potato.  1 cup fresh fruit or one small piece of fruit.    cup canned or frozen fruit or fruit juice.  1 cup milk.   cup plain fat-free yogurt or yogurt sweetened with artificial sweeteners.   cup cooked dried beans or starchy vegetable, such as peas, corn, or potatoes.  Decide the number of standard-size servings that you will eat. Multiply that number of servings by 15 (the grams of carbohydrates in that serving). For example, if you eat 2 cups of strawberries, you will have eaten 2 servings and 30 g of carbohydrates (2 servings x 15 g = 30 g). For foods such as soups and casseroles, in which more than one food is mixed in, you will need to count the carbohydrates in each food that is included. EXAMPLE OF CARBOHYDRATE COUNTING Sample Dinner  3 oz chicken breast.   cup of  brown rice.   cup of corn.  1 cup milk.   1 cup strawberries with sugar-free whipped topping.  Carbohydrate Calculation Step 1: Identify the foods that contain carbohydrates:   Rice.   Corn.   Milk.   Strawberries. Step 2:Calculate the number of servings eaten of each:   2 servings of rice.   1 serving of corn.   1 serving of milk.   1 serving of strawberries. Step 3: Multiply each of those number of servings by 15 g:   2 servings of rice x 15 g = 30 g.   1 serving of  corn x 15 g = 15 g.   1 serving of milk x 15 g = 15 g.   1 serving of strawberries x 15 g = 15 g. Step 4: Add together all of the amounts to find the total grams of carbohydrates eaten: 30 g + 15 g + 15 g + 15 g = 75 g.   This information is not intended to replace advice given to you by your health care provider. Make sure you discuss any questions you have with your health care provider.   Document Released: 04/04/2005 Document Revised: 04/25/2014 Document Reviewed: 03/01/2013 Elsevier Interactive Patient Education [employer].

## 2015-03-04 NOTE — Progress Notes (Signed)
Pre visit review using our clinic review tool, if applicable. No additional management support is needed unless otherwise documented below in the visit note. 

## 2015-03-13 ENCOUNTER — Encounter: Payer: Self-pay | Admitting: Family Medicine

## 2015-03-13 NOTE — Progress Notes (Signed)
Subjective:    Patient ID: Zachary Joyce, male    DOB: 09-22-1950, 64 y.o.   MRN: [id number]  Chief Complaint  Patient presents with  . Follow-up    DM, HTN  . shaking in left hand    shaking comes in goes, only in left hand x 2 months    HPI Patient is in today for follow-up. He is accompanied by his wife. Overall he is doing well and denies recent illness. He does note some increasing fatigue but no falls or weakness. He also notes an intermittent tremor in his left hand which seems to be worse when he still tired. He had a male relative with similar symptoms. Denies polyuria or polydipsia. Does not check his sugars regularly. Denies CP/palp/SOB/HA/congestion/fevers/GI or GU c/o. Taking meds as prescribed  Past Medical History  Diagnosis Date  . Diabetes mellitus type II   . Hyperlipidemia   . Hypertension   . Personal history of colonic polyps 12/23/2012  . Preventative health care 07/07/2013  . Low back pain 07/07/2013  . Stroke ([place]) 1998  . Tremor 03/04/2015    Past Surgical History  Procedure Laterality Date  . Vasectomy  1980  . [place]oscopy      in [place] over 10 yrs ago, unknown md, 2 hp polyps     Family History  Problem Relation Age of Onset  . Diabetes Father   . Diabetes Brother   . Kidney disease Brother   . Heart disease Mother     MI  . Hyperlipidemia Mother   . Hypertension Mother   . Cancer Brother     lung, heavy smoker  . Lung cancer Brother   . Colon cancer Neg Hx     Social History   Social History  . Marital Status: Married    Spouse Name: N/A  . Number of Children: N/A  . Years of Education: N/A   Occupational History  . Not on file.   Social History Main Topics  . Smoking status: Never Smoker   . Smokeless tobacco: Never Used  . Alcohol Use: No  . Drug Use: No  . Sexual Activity: Yes     Comment: lives with wife, exercises, heart healthy diet   Other Topics Concern  . Not on file   Social History Narrative   Married   works  at [employer]   Alcohol use-no     Tobacco use -no       Outpatient Prescriptions Prior to Visit  Medication Sig Dispense Refill  . aspirin 81 MG tablet Take 81 mg by mouth daily.    . Blood Glucose Monitoring Suppl (ONE TOUCH ULTRA SYSTEM KIT) W/DEVICE KIT 1 kit by Does not apply route once. Use to check blood sugar twice a day-Dx Code 250.00     . calcium-vitamin D (OSCAL 500/200 D-3) 500-200 MG-UNIT per tablet Take 1 tablet by mouth daily.      . Coenzyme Q10 (CO Q 10) 100 MG CAPS Take 100 mg by mouth.    . cyclobenzaprine (FLEXERIL) 10 MG tablet Take 1 tablet (10 mg total) by mouth 3 (three) times daily as needed for muscle spasms. 30 tablet 0  . glucose blood (ONE TOUCH ULTRA TEST) test strip USE AS DIRECTED TO TEST BLOOD GLUCOSE TWICE DAILY DX: 250.00 100 each 5  . Insulin Pen Needle (PEN NEEDLES 31GX5/16") 31G X 8 MM MISC Use once a day as directed 100 each 3  . Misc Natural Products (  PROSTATE HEALTH PO) Take by mouth.    . Multiple Vitamin (MULTIVITAMIN) tablet Take 1 tablet by mouth daily.      . Omega-3 Fatty Acids (FISH OIL) 1000 MG CAPS Take 1,000 mg by mouth 2 (two) times daily.      . simvastatin (ZOCOR) 80 MG tablet TAKE ONE-HALF TABLET BY MOUTH AT BEDTIME 45 tablet 0  . Insulin Glargine (LANTUS SOLOSTAR) 100 UNIT/ML Solostar Pen Inject 60 Units into the skin every morning. 90 mL 1  . lisinopril-hydrochlorothiazide (PRINZIDE,ZESTORETIC) 20-12.5 MG per tablet TAKE ONE TABLET BY MOUTH ONCE DAILY 90 tablet 0  . metFORMIN (GLUCOPHAGE) 1000 MG tablet TAKE ONE TABLET BY MOUTH TWICE DAILY AND  ONE  HALF  TABLET  AT  NOON 225 tablet 0   No facility-administered medications prior to visit.    No Known Allergies  Review of Systems  Constitutional: Negative for fever and malaise/fatigue.  HENT: Negative for congestion.   Eyes: Negative for discharge.  Respiratory: Negative for shortness of breath.   Cardiovascular: Negative for chest pain, palpitations and leg swelling.    Gastrointestinal: Negative for nausea and abdominal pain.  Genitourinary: Negative for dysuria.  Musculoskeletal: Negative for falls.  Skin: Negative for rash.  Neurological: Positive for tremors. Negative for loss of consciousness and headaches.  Endo/Heme/Allergies: Negative for environmental allergies.  Psychiatric/Behavioral: Negative for depression. The patient is not nervous/anxious.        Objective:    Physical Exam  Constitutional: He is oriented to person, place, and time. He appears well-developed and well-nourished. No distress.  HENT:  Head: Normocephalic and atraumatic.  Nose: Nose normal.  Eyes: Right eye exhibits no discharge. Left eye exhibits no discharge.  Neck: Normal range of motion. Neck supple.  Cardiovascular: Normal rate and regular rhythm.   No murmur heard. Pulmonary/Chest: Effort normal and breath sounds normal.  Abdominal: Soft. Bowel sounds are normal. There is no tenderness.  Musculoskeletal: He exhibits no edema.  Neurological: He is alert and oriented to person, place, and time.  Skin: Skin is warm and dry.  Psychiatric: He has a normal mood and affect.  Nursing note and vitals reviewed.   BP 132/78 mmHg  Pulse 81  Temp(Src) 98 F (36.7 C) (Oral)  Resp 16  Wt 174 lb (78.926 kg)  SpO2 99% Wt Readings from Last 3 Encounters:  03/04/15 174 lb (78.926 kg)  01/02/15 166 lb (75.297 kg)  12/19/14 171 lb (77.565 kg)     Lab Results  Component Value Date   WBC 6.6 02/23/2015   HGB 15.3 02/23/2015   HCT 45.3 02/23/2015   PLT 176.0 02/23/2015   GLUCOSE 189* 02/23/2015   CHOL 161 02/23/2015   TRIG 304.0* 02/23/2015   HDL 36.30* 02/23/2015   LDLDIRECT 81.0 02/23/2015   LDLCALC 51 06/30/2014   ALT 31 02/23/2015   AST 22 02/23/2015   NA 138 02/23/2015   K 3.9 02/23/2015   CL 100 02/23/2015   CREATININE 1.01 02/23/2015   BUN 23 02/23/2015   CO2 31 02/23/2015   TSH 1.18 02/23/2015   PSA 0.47 06/30/2014   HGBA1C 7.7* 02/23/2015    MICROALBUR 0.50 12/13/2012    Lab Results  Component Value Date   TSH 1.18 02/23/2015   Lab Results  Component Value Date   WBC 6.6 02/23/2015   HGB 15.3 02/23/2015   HCT 45.3 02/23/2015   MCV 98.2 02/23/2015   PLT 176.0 02/23/2015   Lab Results  Component Value Date   NA 138  02/23/2015   K 3.9 02/23/2015   CO2 31 02/23/2015   GLUCOSE 189* 02/23/2015   BUN 23 02/23/2015   CREATININE 1.01 02/23/2015   BILITOT 0.6 02/23/2015   ALKPHOS 71 02/23/2015   AST 22 02/23/2015   ALT 31 02/23/2015   PROT 7.1 02/23/2015   ALBUMIN 4.4 02/23/2015   CALCIUM 10.4 02/23/2015   GFR 79.28 02/23/2015   Lab Results  Component Value Date   CHOL 161 02/23/2015   Lab Results  Component Value Date   HDL 36.30* 02/23/2015   Lab Results  Component Value Date   LDLCALC 51 06/30/2014   Lab Results  Component Value Date   TRIG 304.0* 02/23/2015   Lab Results  Component Value Date   CHOLHDL 4 02/23/2015   Lab Results  Component Value Date   HGBA1C 7.7* 02/23/2015       Assessment & Plan:   Problem List Items Addressed This Visit    Diabetes mellitus type 2, uncontrolled ([place]) - Primary    hgba1c mildly elevated, minimize simple carbs. Increase exercise as tolerated. Continue current meds for now      Relevant Medications   Insulin Glargine (LANTUS SOLOSTAR) 100 UNIT/ML Solostar Pen   lisinopril-hydrochlorothiazide (PRINZIDE,ZESTORETIC) 20-12.5 MG tablet   metFORMIN (GLUCOPHAGE) 1000 MG tablet   Other Relevant Orders   CBC   TSH   Comprehensive metabolic panel   Hemoglobin A1c   Lipid panel   Microalbumin / creatinine urine ratio   Essential hypertension    Well controlled, no changes to meds. Encouraged heart healthy diet such as the DASH diet and exercise as tolerated.       Relevant Medications   Insulin Glargine (LANTUS SOLOSTAR) 100 UNIT/ML Solostar Pen   lisinopril-hydrochlorothiazide (PRINZIDE,ZESTORETIC) 20-12.5 MG tablet   metoprolol succinate (TOPROL-XL) 25  MG 24 hr tablet   Other Relevant Orders   CBC   TSH   Comprehensive metabolic panel   Hemoglobin A1c   Lipid panel   Microalbumin / creatinine urine ratio   Hyperlipidemia    Tolerating statin, encouraged heart healthy diet, avoid trans fats, minimize simple carbs and saturated fats. Increase exercise as tolerated      Relevant Medications   Insulin Glargine (LANTUS SOLOSTAR) 100 UNIT/ML Solostar Pen   lisinopril-hydrochlorothiazide (PRINZIDE,ZESTORETIC) 20-12.5 MG tablet   metoprolol succinate (TOPROL-XL) 25 MG 24 hr tablet   Other Relevant Orders   CBC   TSH   Comprehensive metabolic panel   Hemoglobin A1c   Lipid panel   Microalbumin / creatinine urine ratio   Tremor    Left hand worse when he is tired. Did have a relative with a similar malady. Offered a referral to neurology but declines for now. Will report if symptoms worsen      Relevant Orders   CBC   TSH   Comprehensive metabolic panel   Hemoglobin A1c   Lipid panel   Microalbumin / creatinine urine ratio      I have changed Mr. Kinnan lisinopril-hydrochlorothiazide. I am also having him start on metoprolol succinate. Additionally, I am having him maintain his Fish Oil, multivitamin, calcium-vitamin D, ONE TOUCH ULTRA SYSTEM KIT, cyclobenzaprine, PEN NEEDLES 31GX5/16", glucose blood, aspirin, Co Q 10, Misc Natural Products (PROSTATE HEALTH PO), simvastatin, Insulin Glargine, and metFORMIN.  Meds ordered this encounter  Medications  . Insulin Glargine (LANTUS SOLOSTAR) 100 UNIT/ML Solostar Pen    Sig: Inject 60 Units into the skin every morning.    Dispense:  90 mL  Refill:  1  . lisinopril-hydrochlorothiazide (PRINZIDE,ZESTORETIC) 20-12.5 MG tablet    Sig: Take 1 tablet by mouth daily.    Dispense:  90 tablet    Refill:  1  . metFORMIN (GLUCOPHAGE) 1000 MG tablet    Sig: TAKE ONE TABLET BY MOUTH TWICE DAILY AND  ONE  HALF  TABLET  AT  NOON    Dispense:  225 tablet    Refill:  1  . metoprolol succinate  (TOPROL-XL) 25 MG 24 hr tablet    Sig: Take 1 tablet (25 mg total) by mouth daily.    Dispense:  90 tablet    Refill:  1     Penni Homans, MD

## 2015-03-13 NOTE — Assessment & Plan Note (Signed)
hgba1c mildly elevated, minimize simple carbs. Increase exercise as tolerated. Continue current meds for now

## 2015-03-13 NOTE — Assessment & Plan Note (Signed)
Left hand worse when he is tired. Did have a relative with a similar malady. Offered a referral to neurology but declines for now. Will report if symptoms worsen

## 2015-03-13 NOTE — Assessment & Plan Note (Signed)
Tolerating statin, encouraged heart healthy diet, avoid trans fats, minimize simple carbs and saturated fats. Increase exercise as tolerated 

## 2015-03-13 NOTE — Assessment & Plan Note (Signed)
Well controlled, no changes to meds. Encouraged heart healthy diet such as the DASH diet and exercise as tolerated.  °

## 2015-04-21 ENCOUNTER — Telehealth: Payer: Self-pay | Admitting: *Deleted

## 2015-04-21 NOTE — Telephone Encounter (Signed)
PA initiated through [facility], awaiting determination. JG//CMA

## 2015-04-24 NOTE — Telephone Encounter (Signed)
Approved.  

## 2015-05-18 ENCOUNTER — Telehealth: Payer: Self-pay | Admitting: Family Medicine

## 2015-05-18 DIAGNOSIS — I10 Essential (primary) hypertension: Secondary | ICD-10-CM

## 2015-05-18 DIAGNOSIS — E785 Hyperlipidemia, unspecified: Secondary | ICD-10-CM

## 2015-05-18 MED ORDER — INSULIN GLARGINE 100 UNIT/ML SOLOSTAR PEN: 60.0000 [IU] | PEN_INJECTOR | Freq: Every morning | SUBCUTANEOUS | Status: AC

## 2015-05-18 NOTE — Telephone Encounter (Signed)
Sent in for one month after talking to the daughter.

## 2015-05-18 NOTE — Telephone Encounter (Signed)
Pt son called stating pt is out of lantus and is out of town. [facility]   [facility] [place] [place] PH# [phone]

## 2015-06-17 ENCOUNTER — Other Ambulatory Visit: Payer: BC Managed Care – PPO

## 2015-06-25 ENCOUNTER — Ambulatory Visit: Payer: BC Managed Care – PPO | Admitting: Family Medicine

## 2015-07-02 ENCOUNTER — Other Ambulatory Visit: Payer: Self-pay | Admitting: Family Medicine

## 2015-07-02 DIAGNOSIS — R251 Tremor, unspecified: Secondary | ICD-10-CM

## 2015-07-07 ENCOUNTER — Ambulatory Visit
Admission: RE | Admit: 2015-07-07 | Discharge: 2015-07-07 | Disposition: A | Discharge status: Home / Self Care | Payer: BC Managed Care – PPO | Source: Ambulatory Visit | Attending: Family Medicine | Admitting: Family Medicine

## 2015-07-07 DIAGNOSIS — R251 Tremor, unspecified: Secondary | ICD-10-CM

## 2020-06-23 ENCOUNTER — Encounter (HOSPITAL_BASED_OUTPATIENT_CLINIC_OR_DEPARTMENT_OTHER): Payer: Self-pay

## 2020-06-23 SURGERY — DONT USE, USE 1094-COLONOSCOPY, DIAGNOSTIC (SCREENING)
Anesthesia: Conscious Sedation | Site: Anus

## 2020-12-06 ENCOUNTER — Observation Stay
Admission: EM | Admit: 2020-12-06 | Discharge: 2020-12-07 | Disposition: A | Discharge status: Home / Self Care | Payer: Medicare PPO | Attending: Internal Medicine | Admitting: Internal Medicine

## 2020-12-06 ENCOUNTER — Emergency Department: Payer: Medicare PPO

## 2020-12-06 ENCOUNTER — Observation Stay: Payer: Medicare PPO

## 2020-12-06 DIAGNOSIS — R42 Dizziness and giddiness: Secondary | ICD-10-CM

## 2020-12-06 DIAGNOSIS — Z7982 Long term (current) use of aspirin: Secondary | ICD-10-CM | POA: Insufficient documentation

## 2020-12-06 DIAGNOSIS — G20 Parkinson's disease: Secondary | ICD-10-CM | POA: Insufficient documentation

## 2020-12-06 DIAGNOSIS — R55 Syncope and collapse: Secondary | ICD-10-CM | POA: Insufficient documentation

## 2020-12-06 DIAGNOSIS — E118 Type 2 diabetes mellitus with unspecified complications: Secondary | ICD-10-CM

## 2020-12-06 DIAGNOSIS — E785 Hyperlipidemia, unspecified: Secondary | ICD-10-CM | POA: Insufficient documentation

## 2020-12-06 DIAGNOSIS — I10 Essential (primary) hypertension: Secondary | ICD-10-CM | POA: Insufficient documentation

## 2020-12-06 DIAGNOSIS — U071 COVID-19: Secondary | ICD-10-CM | POA: Insufficient documentation

## 2020-12-06 DIAGNOSIS — R112 Nausea with vomiting, unspecified: Secondary | ICD-10-CM

## 2020-12-06 DIAGNOSIS — Z794 Long term (current) use of insulin: Secondary | ICD-10-CM | POA: Insufficient documentation

## 2020-12-06 DIAGNOSIS — E119 Type 2 diabetes mellitus without complications: Secondary | ICD-10-CM | POA: Insufficient documentation

## 2020-12-06 DIAGNOSIS — E860 Dehydration: Principal | ICD-10-CM | POA: Insufficient documentation

## 2020-12-06 DIAGNOSIS — N179 Acute kidney failure, unspecified: Secondary | ICD-10-CM | POA: Insufficient documentation

## 2020-12-06 DIAGNOSIS — R0602 Shortness of breath: Secondary | ICD-10-CM | POA: Insufficient documentation

## 2020-12-06 DIAGNOSIS — Z79899 Other long term (current) drug therapy: Secondary | ICD-10-CM | POA: Insufficient documentation

## 2020-12-06 HISTORY — DX: Type 2 diabetes mellitus without complications: E11.9

## 2020-12-06 HISTORY — DX: Hyperlipidemia, unspecified: E78.5

## 2020-12-06 HISTORY — DX: Parkinson's disease without dyskinesia, without mention of fluctuations: G20.A1

## 2020-12-06 HISTORY — DX: Essential (primary) hypertension: I10

## 2020-12-06 LAB — I-STAT CHEM 8 CARTRIDGE
Anion Gap I-Stat: 18 — ABNORMAL HIGH (ref 7.0–16.0)
BUN I-Stat: 28 mg/dL — ABNORMAL HIGH (ref 7–22)
Calcium Ionized I-Stat: 4.9 mg/dL (ref 4.35–5.10)
Chloride I-Stat: 104 mMol/L (ref 98–110)
Creatinine I-Stat: 1.4 mg/dL — ABNORMAL HIGH (ref 0.80–1.30)
EGFR: 55 mL/min/{1.73_m2} — ABNORMAL LOW (ref 60–150)
Glucose I-Stat: 137 mg/dL — ABNORMAL HIGH (ref 71–99)
Hematocrit I-Stat: 54 % — ABNORMAL HIGH (ref 39.0–52.5)
Hemoglobin I-Stat: 18.4 gm/dL — ABNORMAL HIGH (ref 13.0–17.5)
Potassium I-Stat: 4.8 mMol/L (ref 3.5–5.3)
Sodium I-Stat: 142 mMol/L (ref 136–147)
TCO2 I-Stat: 26 mMol/L (ref 24–29)

## 2020-12-06 LAB — VH DEXTROSE STICK GLUCOSE
Glucose POCT: 113 mg/dL — ABNORMAL HIGH (ref 71–99)
Glucose POCT: 128 mg/dL — ABNORMAL HIGH (ref 71–99)

## 2020-12-06 LAB — PT/INR
PT INR: 1 (ref 0.9–1.1)
PT: 10.8 s (ref 9.4–11.5)

## 2020-12-06 LAB — I-STAT TROPONIN: Troponin I I-Stat: <0.02 ng/mL (ref 0.00–0.08)

## 2020-12-06 LAB — HEPATIC FUNCTION PANEL
ALT: 34 U/L (ref 0–55)
AST (SGOT): 29 U/L (ref 10–42)
Albumin/Globulin Ratio: 1.38 Ratio (ref 0.80–2.00)
Albumin: 4.7 gm/dL (ref 3.5–5.0)
Alkaline Phosphatase: 82 U/L (ref 40–145)
Bilirubin Direct: 0.3 mg/dL (ref 0.0–0.3)
Bilirubin, Total: 0.9 mg/dL (ref 0.1–1.2)
Globulin: 3.4 gm/dL (ref 2.0–4.0)
Protein, Total: 8.1 gm/dL (ref 6.0–8.3)

## 2020-12-06 LAB — TROPONIN I: Troponin I: <0.01 ng/mL (ref 0.00–0.02)

## 2020-12-06 LAB — CBC AND DIFFERENTIAL
Bands: 9 % (ref 0–10)
Basophils Absolute: 0 10*3/uL (ref 0.0–0.3)
Eosinophils Absolute: 0 10*3/uL (ref 0.0–0.8)
Hematocrit: 52.9 % — ABNORMAL HIGH (ref 39.0–52.5)
Hemoglobin: 17.5 gm/dL (ref 13.0–17.5)
Lymphocytes Absolute: 0.7 10*3/uL (ref 0.6–5.1)
Lymphocytes: 5 % — ABNORMAL LOW (ref 15.0–46.0)
MCH: 33 pg (ref 28–35)
MCHC: 33 gm/dL (ref 32–36)
MCV: 101 fL — ABNORMAL HIGH (ref 80–100)
MPV: 9.5 fL (ref 6.0–10.0)
Metamyelocytes: 1 % — ABNORMAL HIGH (ref 0–0)
Monocytes Absolute: 0.7 10*3/uL (ref 0.1–1.7)
Monocytes: 5 % (ref 3.0–15.0)
Myelocytes: 1 % — ABNORMAL HIGH (ref 0–0)
Neutrophils %: 79 % — ABNORMAL HIGH (ref 42.0–78.0)
Neutrophils Absolute: 11.7 10*3/uL — ABNORMAL HIGH (ref 1.7–8.6)
PLT CT: 179 10*3/uL (ref 130–440)
RBC: 5.24 10*6/uL (ref 4.00–5.70)
RDW: 12.3 % (ref 11.0–14.0)
WBC: 13 10*3/uL — ABNORMAL HIGH (ref 4.0–11.0)

## 2020-12-06 LAB — VH XPERT XPRESS © COV-2/FLU/RSV PLUS
Does patient reside in a congregate care setting?: NEGATIVE
Influenza A RNA: NEGATIVE
Influenza B RNA: NEGATIVE
Is patient employed in a healthcare setting?: NEGATIVE
Is the patient pregnant?: NEGATIVE
RSV RNA: NEGATIVE
SARS-CoV-2 RNA: POSITIVE — CR

## 2020-12-06 LAB — ECG 12-LEAD
P Wave Axis: 63 deg
P-R Interval: 131 ms
Patient Age: 69 years
Q-T Interval(Corrected): 447 ms
Q-T Interval: 346 ms
QRS Axis: 76 deg
QRS Duration: 85 ms
T Axis: 38 years
Ventricular Rate: 100 //min

## 2020-12-06 LAB — C-REACTIVE PROTEIN: C-Reactive Protein: 1.78 mg/dL — ABNORMAL HIGH (ref 0.02–0.80)

## 2020-12-06 LAB — VH I-STAT CHEM 8 NOTIFICATION

## 2020-12-06 LAB — LACTATE DEHYDROGENASE: LDH: 188 U/L (ref 94–250)

## 2020-12-06 LAB — CK
Creatine Kinase (CK): 128 U/L (ref 30–230)
Creatine Kinase (CK): 97 U/L (ref 30–230)

## 2020-12-06 LAB — VH I-STAT TROPONIN NOTIFICATION

## 2020-12-06 LAB — B-TYPE NATRIURETIC PEPTIDE: B-Natriuretic Peptide: 19.7 pg/mL (ref 0.0–100.0)

## 2020-12-06 LAB — D-DIMER, QUANTITATIVE: D-Dimer: 0.44 mg/L FEU (ref 0.19–0.52)

## 2020-12-06 LAB — FERRITIN: Ferritin: 38.7 ng/mL (ref 21.8–274.6)

## 2020-12-06 MED ORDER — ONDANSETRON HCL 4 MG/2ML IJ SOLN
4.0000 mg | Freq: Once | INTRAMUSCULAR | Status: AC
Start: 2020-12-06 — End: 2020-12-06
  Administered 2020-12-06: 4 mg via INTRAVENOUS

## 2020-12-06 MED ORDER — PRAMIPEXOLE DIHYDROCHLORIDE 0.125 MG PO TABS
0.2500 mg | ORAL_TABLET | Freq: Three times a day (TID) | ORAL | Status: DC
Start: 2020-12-06 — End: 2020-12-07
  Administered 2020-12-06 – 2020-12-07 (×3): 0.25 mg via ORAL
  Filled 2020-12-06 (×4): qty 2

## 2020-12-06 MED ORDER — CARBOXYMETHYLCELLULOSE SOD PF 0.5 % OP SOLN
1.0000 [drp] | Freq: Three times a day (TID) | OPHTHALMIC | Status: DC | PRN
Start: 2020-12-06 — End: 2020-12-07

## 2020-12-06 MED ORDER — INSULIN LISPRO (1 UNIT DIAL) 100 UNIT/ML SC SOPN
1.0000 [IU] | PEN_INJECTOR | Freq: Every evening | SUBCUTANEOUS | Status: DC
Start: 2020-12-06 — End: 2020-12-07

## 2020-12-06 MED ORDER — INSULIN DETEMIR 100 UNIT/ML SC SOPN
72.0000 [IU] | PEN_INJECTOR | Freq: Every morning | SUBCUTANEOUS | Status: DC
Start: 2020-12-07 — End: 2020-12-07
  Administered 2020-12-07: 09:00:00 72 [IU] via SUBCUTANEOUS
  Filled 2020-12-06: qty 3

## 2020-12-06 MED ORDER — SODIUM CHLORIDE (PF) 0.9 % IJ SOLN
3.0000 mL | Freq: Three times a day (TID) | INTRAMUSCULAR | Status: DC
Start: 2020-12-06 — End: 2020-12-07
  Administered 2020-12-06 – 2020-12-07 (×2): 3 mL via INTRAVENOUS

## 2020-12-06 MED ORDER — ACETAMINOPHEN 650 MG RE SUPP
650.0000 mg | RECTAL | Status: DC | PRN
Start: 2020-12-06 — End: 2020-12-07

## 2020-12-06 MED ORDER — ACETAMINOPHEN 325 MG PO TABS
650.0000 mg | ORAL_TABLET | ORAL | Status: DC | PRN
Start: 2020-12-06 — End: 2020-12-07
  Administered 2020-12-06: 650 mg via ORAL
  Filled 2020-12-06: qty 2

## 2020-12-06 MED ORDER — INSULIN LISPRO (1 UNIT DIAL) 100 UNIT/ML SC SOPN
2.0000 [IU] | PEN_INJECTOR | Freq: Three times a day (TID) | SUBCUTANEOUS | Status: DC
Start: 2020-12-06 — End: 2020-12-07

## 2020-12-06 MED ORDER — VH SODIUM CHLORIDE 0.9 % IV BOLUS
1000.0000 mL | Freq: Once | INTRAVENOUS | Status: AC
Start: 2020-12-06 — End: 2020-12-06
  Administered 2020-12-06: 1000 mL via INTRAVENOUS

## 2020-12-06 MED ORDER — GLUCAGON 1 MG IJ SOLR (WRAP)
1.0000 mg | INTRAMUSCULAR | Status: DC | PRN
Start: 2020-12-06 — End: 2020-12-07

## 2020-12-06 MED ORDER — VH FISH OIL 1000 MG PO (WRAP)
1.0000 | ORAL_CAPSULE | Freq: Every morning | ORAL | Status: DC
Start: 2020-12-07 — End: 2020-12-07
  Administered 2020-12-07: 09:00:00 1000 mg via ORAL
  Filled 2020-12-06: qty 1

## 2020-12-06 MED ORDER — VH DEXTROSE 10 % IV BOLUS (ADULT)
125.0000 mL | INTRAVENOUS | Status: DC | PRN
Start: 2020-12-06 — End: 2020-12-07

## 2020-12-06 MED ORDER — ASPIRIN 81 MG PO TBEC
81.0000 mg | DELAYED_RELEASE_TABLET | Freq: Every morning | ORAL | Status: DC
Start: 2020-12-07 — End: 2020-12-07
  Administered 2020-12-07: 09:00:00 81 mg via ORAL
  Filled 2020-12-06: qty 1

## 2020-12-06 MED ORDER — VITAMIN D 25 MCG (1000 UT) PO TABS
1.0000 | ORAL_TABLET | Freq: Every day | ORAL | Status: DC
Start: 2020-12-07 — End: 2020-12-07
  Administered 2020-12-07: 09:00:00 25 ug via ORAL
  Filled 2020-12-06: qty 1

## 2020-12-06 MED ORDER — INSULIN LISPRO (1 UNIT DIAL) 100 UNIT/ML SC SOPN
1.0000 [IU] | PEN_INJECTOR | Freq: Every day | SUBCUTANEOUS | Status: DC | PRN
Start: 2020-12-06 — End: 2020-12-07

## 2020-12-06 MED ORDER — VITAMIN B-12 1000 MCG PO TABS
2500.0000 ug | ORAL_TABLET | Freq: Every day | ORAL | Status: DC
Start: 2020-12-07 — End: 2020-12-07
  Administered 2020-12-07: 09:00:00 2500 ug via ORAL
  Filled 2020-12-06: qty 3

## 2020-12-06 MED ORDER — ONDANSETRON HCL 4 MG/2ML IJ SOLN
INTRAMUSCULAR | Status: AC
Start: 2020-12-06 — End: ?
  Filled 2020-12-06: qty 2

## 2020-12-06 MED ORDER — SODIUM CHLORIDE (PF) 0.9 % IJ SOLN
0.4000 mg | INTRAMUSCULAR | Status: DC | PRN
Start: 2020-12-06 — End: 2020-12-07

## 2020-12-06 MED ORDER — PRAMIPEXOLE DIHYDROCHLORIDE 1 MG PO TABS
0.7500 mg | ORAL_TABLET | Freq: Every morning | ORAL | Status: DC
Start: 2020-12-07 — End: 2020-12-06

## 2020-12-06 MED ORDER — SODIUM CHLORIDE 0.9 % IV SOLN
INTRAVENOUS | Status: AC
Start: 2020-12-06 — End: 2020-12-07

## 2020-12-06 MED ORDER — CARBIDOPA-LEVODOPA 25-100 MG PO TABS
2.0000 | ORAL_TABLET | Freq: Four times a day (QID) | ORAL | Status: DC
Start: 2020-12-06 — End: 2020-12-07
  Administered 2020-12-06 – 2020-12-07 (×3): 2 via ORAL
  Filled 2020-12-06 (×3): qty 2

## 2020-12-06 MED ORDER — ACETAMINOPHEN 160 MG/5ML PO SOLN
650.0000 mg | ORAL | Status: DC | PRN
Start: 2020-12-06 — End: 2020-12-07

## 2020-12-06 MED ORDER — VH SODIUM CHLORIDE 0.9 % IV BOLUS
1000.0000 mL | Freq: Once | INTRAVENOUS | Status: AC
Start: 2020-12-06 — End: 2020-12-06
  Administered 2020-12-06: 20:00:00 1000 mL via INTRAVENOUS

## 2020-12-06 MED ORDER — CEROVITE ADVANCED FORMULA PO TABS
1.0000 | ORAL_TABLET | Freq: Every day | ORAL | Status: DC
Start: 2020-12-07 — End: 2020-12-07
  Administered 2020-12-07: 1 via ORAL
  Filled 2020-12-06: qty 1

## 2020-12-06 MED ORDER — ROSUVASTATIN CALCIUM 20 MG PO TABS
20.0000 mg | ORAL_TABLET | Freq: Every evening | ORAL | Status: DC
Start: 2020-12-06 — End: 2020-12-07
  Administered 2020-12-06: 20:00:00 20 mg via ORAL
  Filled 2020-12-06: qty 1

## 2020-12-06 MED ORDER — SENNA 8.6 MG PO TABS
17.2000 mg | ORAL_TABLET | Freq: Every day | ORAL | Status: DC | PRN
Start: 2020-12-06 — End: 2020-12-07

## 2020-12-06 MED ORDER — ENOXAPARIN SODIUM 40 MG/0.4ML IJ SOSY
40.0000 mg | PREFILLED_SYRINGE | INTRAMUSCULAR | Status: DC
Start: 2020-12-06 — End: 2020-12-07
  Administered 2020-12-06: 20:00:00 40 mg via SUBCUTANEOUS
  Filled 2020-12-06: qty 0.4

## 2020-12-06 NOTE — UM Notes (Signed)
[facility] Utilization Management Review Sheet    Facility :  [facility]    NAME: Joseph Haynes  MR#: [id number]    CSN#: [id number]    ROOM: 558/558-A AGE: 70 y.o.    Date of Birth: [birth year]    ADMIT DATE AND TIME: 12/06/2020 @ 1423    PATIENT CLASS: OBS    ATTENDING PHYSICIAN: Esmond Harps, DO      PAYOR:Payor: MEDICARE / Plan: MEDICARE PART A AND B / Product Type: Medicare /     AUTH #: NAR    DIAGNOSIS:     ICD-10-CM    1. Syncope and collapse  R55       2. Nausea and vomiting, unspecified vomiting type  R11.2       3. Dizziness  R42       4. Parkinson's disease  G20       5. Type 2 diabetes mellitus with unspecified complications  E11.8       6. Dehydration  E86.0       7. Acute kidney injury  N17.9           HISTORY:   Past Medical History:   Diagnosis Date    Diabetes mellitus, type 2     Hyperlipidemia     Hypertension     Parkinson disease      History of present illness: The patient presents to the emergency department after a syncopal episode.  He took his lantus this am but did not eat or drink.  He went for a walk and felt dizzy.  When he came home he had episodes of vomiting and nausea.  After that he had a syncopal episode witnessed by his wife.  He tested positive for covid. He was not aware that he had covid    Vitals upon admission: 36.9, 99, 96%, 20, 100/55    Treatment in emergency department: Ekg, ns iv bolus x 1, zofran iv x 1    Physical exam: Per md notes diminished hearing.  Tachycardic.  He has upper extremity tremor    Abnormal labs:  Covid ++  Wbc 13.0  Neutrophils 79.0  Lymphocytes 5.0    Imaging:  Chest xray: No acute abnormality.  Ct head: No acute intracranial abnormality.   MRI is more sensitive for acute ischemia.    Assessment/Plan:  Lovenox 40 mg sc qd  Chemsticks with ssi  Ns @ 125 ml/hr iv continuous  Contact isolation  Droplet isolation  Skin assessment qd  Vital signs q 4 hours  Encourage patient to lay fully prone  Oxygen to keep sats >87%  Fall  precautions    Observation appropriate per MCG-25th edition OC-041    Mardelle Matte, RN, BSN  [facility]   Utilization Management  Phone:  [phone]  Fax:  [phone]

## 2020-12-06 NOTE — Progress Notes (Signed)
12/06/20 1943 12/06/20 1944 12/06/20 1946   Vitals   Heart Rate 97 96 (!) 106   BP (!) 67/47 (!) 63/42 (!) 73/42   BP Location Left arm Left arm Left arm   MAP (mmHg) (!) 54 (!) 49 (!) 52   Patient Position Lying Sitting Standing

## 2020-12-06 NOTE — ED Provider Notes (Signed)
History     Chief Complaint   Patient presents with    Syncope    Nausea     Patient with a history of type 2 diabetes, Parkinson's disease presents to the emergency department after a witnessed episode of syncope.  Reportedly the patient took his insulin this morning went for a walk without eating, and during his walk developed some lightheadedness and some nausea subsequently vomited on 2 separate occasions without bloody or bilious emesis.  While with his family patient became unresponsive for several minutes, he sustained no injuries or trauma.  Patient notes he is nearly back to baseline currently.  His dextrose stick was 112 by paramedics.  Patient had no seizure activity.  Patient denies any other premonitory symptoms prior to his loss of consciousness.  Patient has no previous history of syncope.  Patient denies any diarrhea or bloody or black stools.  Patient has had no recent chest discomfort, dyspnea or fever.  Patient denies abdominal or back pain.  Patient has had no cough, sore throat, myalgias, headache, visual, gait or speech disturbances.  Patient denies any weakness or numbness.  Patient has had no recent injuries or falls.  Patient notes that he typically walks 8 miles a day.      Loss of Consciousness  Episode history:  Single  Most recent episode:  Today  Duration:  2 minutes  Timing:  Rare  Progression:  Resolved  Chronicity:  New  Witnessed: yes    Relieved by:  Nothing  Worsened by:  Nothing  Ineffective treatments:  None tried  Associated symptoms: dizziness, nausea and vomiting    Associated symptoms: no anxiety, no chest pain, no confusion, no diaphoresis, no difficulty breathing, no fever, no focal sensory loss, no focal weakness, no headaches, no palpitations, no recent fall, no recent injury, no recent surgery, no rectal bleeding, no seizures, no shortness of breath, no visual change and no weakness    Risk factors: no congenital heart disease, no coronary artery disease, no  seizures and no vascular disease       Past Medical History:   Diagnosis Date    Diabetes mellitus     Hyperlipidemia     Hypertension     Parkinson disease        History reviewed. No pertinent surgical history.    History reviewed. No pertinent family history.    Social       .     No Known Allergies    Home Medications       Med List Status: In Progress Set By: Rowe Robert, RN at 12/06/2020 11:58 AM              Accu-Chek Aviva Plus test strip     USE TO TEST TWICE DAILY     aspirin (Aspirin 81) 81 MG chewable tablet     Chew 81 mg by mouth daily     B-D UF III MINI PEN NEEDLES 31G X 5 MM Misc     USE ONCE DAILY TO INJECT INSULIN     carbidopa-levodopa (SINEMET) 25-100 MG per tablet     2 (TWO) TABLET FOUR TIMES PER DAY     Coenzyme Q10 (Co Q-10) 100 MG Cap     Take by mouth     Cyanocobalamin (B-12) 2500 MCG Tab     Take by mouth     Levemir FlexTouch 100 UNIT/ML injection pen     INJECT 72 UNITS SUBCUTANEOUSLY EVERY MORNING  lisinopril (ZESTRIL) 40 MG tablet     Take 40 mg by mouth daily     metFORMIN (GLUCOPHAGE) 1000 MG tablet     TAKE 1 TABLET (1,000 MG) BY ORAL ROUTE 2 TIMES PER DAY AND 0.5 TABLET AT NOON     Multiple Vitamins-Minerals (CENTRUM ADULTS PO)     Take by mouth     Omega-3 Fatty Acids (FISH OIL PO)     Take by mouth     Pramipexole Dihydrochloride 0.75 MG Tablet SR 24 hr     Take 1 tablet by mouth daily     rosuvastatin (CRESTOR) 20 MG tablet     TAKE 1 TABLET (20 MG) BY ORAL ROUTE ONCE DAILY TO REPLACE SIMVASTATIN     vitamin D3 (D3 5000) 125 MCG (5000 UT) capsule     Take 5,000 Units by mouth daily             Review of Systems   Constitutional:  Negative for diaphoresis and fever.   HENT:  Negative for congestion, ear pain, rhinorrhea and sore throat.    Eyes:  Negative for discharge and redness.   Respiratory:  Negative for cough, chest tightness and shortness of breath.    Cardiovascular:  Positive for syncope. Negative for chest pain, palpitations and leg swelling.   Gastrointestinal:   Positive for nausea and vomiting. Negative for abdominal pain, blood in stool, constipation and diarrhea.   Genitourinary:  Negative for decreased urine volume, dysuria, flank pain, frequency, penile swelling, scrotal swelling, testicular pain and urgency.   Musculoskeletal:  Negative for joint swelling, myalgias and neck stiffness.   Skin:  Negative for rash.   Neurological:  Positive for dizziness. Negative for focal weakness, seizures, weakness and headaches.   Hematological:  Negative for adenopathy. Does not bruise/bleed easily.   Psychiatric/Behavioral:  Negative for confusion and suicidal ideas.      Physical Exam    BP: 100/55, Heart Rate: 99, Temp: 98.4 F (36.9 C), Resp Rate: 20, SpO2: 96 %, Weight: 69.4 kg    Physical Exam  Vitals and nursing note reviewed.   Constitutional:       General: He is not in acute distress.     Appearance: He is well-developed. He is not diaphoretic.   HENT:      Head: Normocephalic and atraumatic.      Right Ear: External ear normal.      Left Ear: External ear normal.      Nose: Nose normal.      Mouth/Throat:      Mouth: Mucous membranes are moist.      Pharynx: Oropharynx is clear. No oropharyngeal exudate.   Eyes:      General: No scleral icterus.        Right eye: No discharge.         Left eye: No discharge.      Extraocular Movements: Extraocular movements intact.      Conjunctiva/sclera: Conjunctivae normal.      Pupils: Pupils are equal, round, and reactive to light.   Neck:      Thyroid: No thyroid mass or thyromegaly.      Vascular: No JVD.      Trachea: No tracheal deviation.   Cardiovascular:      Rate and Rhythm: Normal rate and regular rhythm.      Pulses: Normal pulses.      Heart sounds: Normal heart sounds. No murmur heard.    No friction rub. No gallop.  Pulmonary:      Effort: Pulmonary effort is normal. No tachypnea, accessory muscle usage or respiratory distress.      Breath sounds: Normal breath sounds. No stridor. No wheezing or rales.   Chest:       Chest wall: No tenderness.   Abdominal:      General: Bowel sounds are normal. There is no distension.      Palpations: Abdomen is soft. There is no mass.      Tenderness: There is no abdominal tenderness. There is no guarding or rebound.   Musculoskeletal:         General: No tenderness. Normal range of motion.      Cervical back: Normal range of motion and neck supple.   Lymphadenopathy:      Cervical: No cervical adenopathy.   Skin:     General: Skin is warm and dry.      Coloration: Skin is not pale.      Findings: No ecchymosis, erythema, lesion, petechiae or rash.      Nails: There is no clubbing.   Neurological:      General: No focal deficit present.      Mental Status: He is alert and oriented to person, place, and time.      GCS: GCS eye subscore is 4. GCS verbal subscore is 5. GCS motor subscore is 6.      Cranial Nerves: Cranial nerves are intact. No cranial nerve deficit.      Sensory: Sensation is intact.      Motor: Motor function is intact. No abnormal muscle tone.      Coordination: Coordination is intact. Coordination normal.   Psychiatric:         Behavior: Behavior normal.         MDM and ED Course     ED Medication Orders (From admission, onward)      Start Ordered     Status Ordering Provider    12/06/20 1207 12/06/20 1206  sodium chloride 0.9 % bolus 1,000 mL  Once in ED        Route: Intravenous  Ordered Dose: 1,000 mL     Last MAR action: New Bag Sonia Baller R    12/06/20 1207 12/06/20 1206  ondansetron (ZOFRAN) injection 4 mg  Once in ED        Route: Intravenous  Ordered Dose: 4 mg     Last MAR action: Given Sonia Baller R               MDM  Number of Diagnoses or Management Options  Acute kidney injury: new and requires workup  Dehydration: new and requires workup  Dizziness: new and requires workup  Nausea and vomiting, unspecified vomiting type: new and requires workup  Parkinson's disease: established and worsening  Syncope and collapse: new and requires workup  Type 2  diabetes mellitus with unspecified complications: new and requires workup  Diagnosis management comments: This patient presents to the Emergency Department with a syncopal episode.  Evaluation and treatment has been initiated in the ER, but the patient has not had significant improvement in symptoms, has injuries or appears ill enough and/or has illness/findings/co-morbidities that make admission for monitoring and further evaluation the most appropriate disposition. Differential diagnosis has included but is not limited to intracranial pathology (tumors, hydrocephalus), meningitis, brain bleeding, vasovagal syncope, orthostatic hypotension, seizure, and cardiac/neurologic causes for syncope.   Diagnostic impression and plan were discussed and agreed upon with the patient and/or family.  Results of lab/radiology tests were reviewed and discussed with the patient and/or family. Questions were answered and concerns addressed.  Appropriate consultation was made for admission and further treatment of this patient.                          Amount and/or Complexity of Data Reviewed  Clinical lab tests: ordered and reviewed  Tests in the radiology section of CPT: ordered and reviewed  Tests in the medicine section of CPT: ordered and reviewed  Decide to obtain previous medical records or to obtain history from someone other than the patient: yes  Discuss the patient with other providers: yes  Independent visualization of images, tracings, or specimens: yes    Risk of Complications, Morbidity, and/or Mortality  Presenting problems: moderate  Diagnostic procedures: low  Management options: moderate    Patient Progress  Patient progress: improved                   Procedures    Clinical Impression & Disposition     Clinical Impression  Final diagnoses:   Syncope and collapse   Nausea and vomiting, unspecified vomiting type   Dizziness   Parkinson's disease   Type 2 diabetes mellitus with unspecified complications    Dehydration   Acute kidney injury        ED Disposition       ED Disposition   Admit    Condition   --    Date/Time   Sun Dec 06, 2020  1:12 PM    Comment   Service: Medicine [106]                  New Prescriptions    No medications on file                   Fabian Sharp, MD  12/06/20 1323

## 2020-12-06 NOTE — H&P (Signed)
Medicine History & Physical   [facility], [place]   Patient Name: Joseph Haynes, Joseph Haynes LOS: 0 days   Attending Physician: Fabian Sharp, MD PCP: Leonie Man, PA      Assessment and Plan:                                                              COVID 19 infection  Admitting to medical floor with with continuous pulse oximetry.  Not hypoxic. Will closely monitor respiratory status.   Strict droplet and contact isolation precautions ordered. PPE being used.  Will avoid use of nebulizers and NSAIDs.  Will follow vital signs and inflammatory markers carefully for signs of decompensation.    Syncope  Likely due to volume depletion and possibly autonomic insufficiency due to Parkinson's .   Monitoring on TELE.   Checking orthostatics.     Dehydration  Hydrating w/ NS IV.   Monitoring chemistries.     Diabetes mellitis, type 2  Will continue Lantus QAM.   Sliding scale Humalog as needed.     N/V  Zofran prn.     Hyperlipidemia  Continuing Crestor + fish oil.     Hypertension  Holding Lisinopril for now. Will monitor BPs.     Parkinson's disease  Continuing Sinemet and Pramipexole.    DVT PPx: Lovenox  Dispo: Observation  Code: full code     History of Presenting Illness                                CC: Syncope.   Joseph Haynes is a 70 y.o. gentleman who presents to the emergency department from home by EMS after syncopal event.  He reports feeling well this morning.  He took his Lantus but did not eat or drink.  He went for a walk.  Toward the end, he felt dizzy.  He came home and had episodes of nausea and vomiting.  Following this he had a syncopal event witnessed by his wife.  Currently, he is feeling better.  He is thirsty.  He denies any fevers or chills.  No cough or cold symptoms.  No chest pain or shortness of breath.  No diarrhea.  Denies abdominal pain.  No swelling of the extremities.  Currently, no other complaints.  He was not aware that he had COVID infection.  Denies  known exposures.  He has been vaccinated for COVID.    Past Medical History:   Diagnosis Date    Diabetes mellitus, type 2     Hyperlipidemia     Hypertension     Parkinson disease      Past Surgical History:  History reviewed. No pertinent surgical history.    Family History:  History reviewed. No pertinent family history.            Subjective     Review of Systems:  Review of systems as noted in HPI. Full 12 point ROS otherwise negative.         Objective   Physical Exam:     Vitals: Haynes:98.4 F (36.9 C) (Oral),  BP:115/87, HR:(!) 101, RR:19, SaO2:96%    1) General Appearance: Alert and oriented. In no acute distress.  2) Eyes: Pink conjunctiva, anicteric sclera. Pupils are equally reactive to light.  3) ENT: Oral mucosa moist with no pharyngeal congestion, erythema or swelling.  Diminished hearing.  4) Neck: Supple, with full range of motion. Trachea is central, no JVD noted  5) Chest: Clear to auscultation bilaterally, no wheezes or rhonchi.  6) CVS: Tachycardic.  Regular rhythm, with no murmurs.  No leg edema.  7) Abdomen: Soft, non-tender, no palpable mass. Bowel sounds normal.   8) Extremities: No pitting edema, pulses palpable, no calf swelling and no joint gross deformity.  9) Skin: Warm, dry with normal skin turgor, no rash  10) GU: No suprapubic pain or CVA tenderness.   11) Neurological: Cranial nerves II-XII intact.  Has left upper extremity tremor.  12) Psychiatric: Affect is appropriate.  Calm and cooperative.      Patient Vitals for the past 12 hrs:   BP Temp Pulse Resp   12/06/20 1402 115/87 -- (!) 101 19   12/06/20 1202 91/58 -- 97 (!) 25   12/06/20 1154 100/55 98.4 F (36.9 C) 99 20     Weight Monitoring 12/06/2020   Weight 69.4 kg   Weight Method Stated       LABS:  CrCl cannot be calculated (Unknown ideal weight.).   Recent Results (from the past 24 hour(s))   ECG 12 lead    Collection Time: 12/06/20 11:56 AM   Result Value Ref Range    Patient Age 70 years    Patient DOB 01/27/1951     Patient  Height      Patient Weight      Interpretation Text       Sinus tachycardia  Baseline wander in lead(s) II,III,aVL,aVF  No previous ECG available for comparison  Electronically Signed On 12-06-2020 14:11:01 EDT by Sonia Baller      Physician Interpreter Sonia Baller     Ventricular Rate 100 //min    QRS Duration 85 ms    P-R Interval 131 ms    Q-Haynes Interval 346 ms    Q-Haynes Interval(Corrected) 447 ms    P Wave Axis 63 deg    QRS Axis 76 deg    Haynes Axis 38 years   Respiratory Specimen Xpert Xpress  CoV-2 / Flu / RSV Plus    Collection Time: 12/06/20 12:01 PM    Specimen: Nasopharyngeal Swab   Result Value Ref Range    Influenza A RNA Negative Negative    Influenza B RNA Negative Negative    RSV RNA Negative Negative    SARS-CoV-2 RNA Positive (AA) Negative    Does patient have symptoms related to condition of interest? Y     Is patient employed in a healthcare setting? N     Does patient reside in a congregate care setting? N     Is the patient pregnant? N    CBC and differential    Collection Time: 12/06/20 12:01 PM   Result Value Ref Range    WBC 13.0 (H) 4.0 - 11.0 K/cmm    RBC 5.24 4.00 - 5.70 M/cmm    Hemoglobin 17.5 13.0 - 17.5 gm/dL    Hematocrit 16.1 (H) 39.0 - 52.5 %    MCV 101 (H) 80 - 100 fL    MCH 33 28 - 35 pg    MCHC 33 32 - 36 gm/dL    RDW 09.6 04.5 - 40.9 %    PLT CT 179 130 - 440 K/cmm    MPV 9.5  6.0 - 10.0 fL    Neutrophils % 79.0 (H) 42.0 - 78.0 %    Lymphocytes 5.0 (L) 15.0 - 46.0 %    Monocytes 5.0 3.0 - 15.0 %    Bands 9 0 - 10 %    Metamyelocytes 1 (H) 0 - 0 %    Myelocytes 1 (H) 0 - 0 %    Neutrophils Absolute 11.7 (H) 1.7 - 8.6 K/cmm    Lymphocytes Absolute 0.7 0.6 - 5.1 K/cmm    Monocytes Absolute 0.7 0.1 - 1.7 K/cmm    Eosinophils Absolute 0.0 0.0 - 0.8 K/cmm    Basophils Absolute 0.0 0.0 - 0.3 K/cmm    RBC Morphology Morphology Consistent with Hemogram    B-type Natriuretic Peptide    Collection Time: 12/06/20 12:01 PM   Result Value Ref Range    B-Natriuretic Peptide 19.7 0.0 - 100.0  pg/mL   Creatine Kinase (CK)    Collection Time: 12/06/20 12:01 PM   Result Value Ref Range    Creatine Kinase (CK) 128 30 - 230 U/L   Hepatic function panel (LFT)    Collection Time: 12/06/20 12:01 PM   Result Value Ref Range    Protein, Total 8.1 6.0 - 8.3 gm/dL    Albumin 4.7 3.5 - 5.0 gm/dL    Alkaline Phosphatase 82 40 - 145 U/L    ALT 34 0 - 55 U/L    AST (SGOT) 29 10 - 42 U/L    Bilirubin, Total 0.9 0.1 - 1.2 mg/dL    Bilirubin Direct 0.3 0.0 - 0.3 mg/dL    Albumin/Globulin Ratio 1.38 0.80 - 2.00 Ratio    Globulin 3.4 2.0 - 4.0 gm/dL   Prothrombin time/INR    Collection Time: 12/06/20 12:01 PM   Result Value Ref Range    PT 10.8 9.4 - 11.5 sec    PT INR 1.0 0.9 - 1.1   I-Stat Troponin Notification    Collection Time: 12/06/20 12:06 PM   Result Value Ref Range    I-STAT Notification Istat Notification    Chem 8 plus only (i-STAT)    Collection Time: 12/06/20 12:06 PM   Result Value Ref Range    I-STAT Notification Istat Notification    i-Stat Troponin    Collection Time: 12/06/20 12:29 PM   Result Value Ref Range    Troponin I I-Stat <0.02 0.00 - 0.08 ng/mL   i-Stat Chem 8 CartrIDge    Collection Time: 12/06/20 12:31 PM   Result Value Ref Range    Sodium I-Stat 142 136 - 147 mMol/L    Potassium I-Stat 4.8 3.5 - 5.3 mMol/L    Chloride I-Stat 104 98 - 110 mMol/L    TCO2 I-Stat 26 24 - 29 mMol/L    Calcium Ionized I-Stat 4.90 4.35 - 5.10 mg/dL    Glucose I-Stat 956 (H) 71 - 99 mg/dL    Creatinine I-Stat 3.87 (H) 0.80 - 1.30 mg/dL    BUN I-Stat 28 (H) 7 - 22 mg/dL    Anion Gap I-Stat 56.4 (H) 7.0 - 16.0    EGFR 55 (L) 60 - 150 mL/min/1.57m2    Hematocrit I-Stat 54.0 (H) 39.0 - 52.5 %    Hemoglobin I-Stat 18.4 (H) 13.0 - 17.5 gm/dL          No Known Allergies   XR Chest AP Portable    Result Date: 12/06/2020  No acute abnormality. ReadingStation:[id number]      Home Medications  Med List Status: Pharmacy Completed Set By: Quintin Alto, PharmD at 12/06/2020  1:45 PM              aspirin 81 MG EC tablet     Take  81 mg by mouth every morning     carbidopa-levodopa (SINEMET) 25-100 MG per tablet     Take 2 tablets by mouth 4 (four) times daily     carboxymethylcellulose, PF, (REFRESH PLUS) 0.5 % ophthalmic solution     Place 1 drop into both eyes 3 (three) times daily as needed (dry eyes)     Coenzyme Q10 (Co Q-10) 100 MG Cap     Take 1 capsule by mouth every morning     Cyanocobalamin (B-12) 2500 MCG Tab     Take 1 tablet by mouth daily     Levemir FlexTouch 100 UNIT/ML injection pen     Inject 72 Units into the skin every morning     lisinopril (ZESTRIL) 40 MG tablet     Take 40 mg by mouth daily     metFORMIN (GLUCOPHAGE) 1000 MG tablet     Take 1,000 mg by mouth 2 (two) times daily with meals Take one tablet two times daily with meals and then 1/2 tablet (500 mg) at lunch     metFORMIN (GLUCOPHAGE) 1000 MG tablet     Take 500 mg by mouth daily with lunch Take one tablet two times daily with meals and then 1/2 tablet (500 mg) at lunch     Multiple Vitamins-Minerals (CENTRUM ADULTS PO)     Take 1 tablet by mouth every morning     Omega-3 Fatty Acids (Fish Oil) 1360 MG Cap     Take 1 capsule by mouth every morning     Pramipexole Dihydrochloride 0.75 MG Tablet SR 24 hr     Take 1 tablet by mouth every morning     rosuvastatin (CRESTOR) 20 MG tablet     Take 20 mg by mouth every evening     vitamin D3 (D3 5000) 125 MCG (5000 UT) capsule     Take 5,000 Units by mouth daily      Ongoing Comment    Merrilyn Puma    12/06/2020  1:42 PM     med bottles used to complete med history by Ambrose Pancoast, CPhT-Adv. Med bottles returned to patient and left in room.             Meds given in the ED:  Medications   0.9% NaCl infusion (has no administration in time range)   sodium chloride 0.9 % bolus 1,000 mL (1,000 mLs Intravenous New Bag 12/06/20 1212)   ondansetron (ZOFRAN) injection 4 mg (4 mg Intravenous Given 12/06/20 1212)        Esmond Harps, DO     12/06/20,2:35 PM   MRN: [id number]                                      CSN: [id number]  DOB: June 20, 1950

## 2020-12-06 NOTE — ED Notes (Signed)
Called report to receiving RN

## 2020-12-06 NOTE — Progress Notes (Signed)
Pt admitted to room 558. Oriented to room and call bell. Assessment completed. Family present at bedside.

## 2020-12-07 LAB — CBC
Hematocrit: 37.4 % — ABNORMAL LOW (ref 39.0–52.5)
Hemoglobin: 12.3 gm/dL — ABNORMAL LOW (ref 13.0–17.5)
MCH: 33 pg (ref 28–35)
MCHC: 33 gm/dL (ref 32–36)
MCV: 101 fL — ABNORMAL HIGH (ref 80–100)
MPV: 9.7 fL (ref 6.0–10.0)
PLT CT: 129 10*3/uL — ABNORMAL LOW (ref 130–440)
RBC: 3.7 10*6/uL — ABNORMAL LOW (ref 4.00–5.70)
RDW: 12.4 % (ref 11.0–14.0)
WBC: 14.5 10*3/uL — ABNORMAL HIGH (ref 4.0–11.0)

## 2020-12-07 LAB — COMPREHENSIVE METABOLIC PANEL
ALT: 7 U/L (ref 0–55)
AST (SGOT): 20 U/L (ref 10–42)
Albumin/Globulin Ratio: 1.25 Ratio (ref 0.80–2.00)
Albumin: 3 gm/dL — ABNORMAL LOW (ref 3.5–5.0)
Alkaline Phosphatase: 44 U/L (ref 40–145)
Anion Gap: 13 mMol/L (ref 7.0–18.0)
BUN / Creatinine Ratio: 26.7 Ratio (ref 10.0–30.0)
BUN: 24 mg/dL — ABNORMAL HIGH (ref 7–22)
Bilirubin, Total: 0.6 mg/dL (ref 0.1–1.2)
CO2: 23 mMol/L (ref 20–30)
Calcium: 8.1 mg/dL — ABNORMAL LOW (ref 8.5–10.5)
Chloride: 110 mMol/L (ref 98–110)
Creatinine: 0.9 mg/dL (ref 0.80–1.30)
EGFR: 93 mL/min/{1.73_m2} (ref 60–150)
Globulin: 2.4 gm/dL (ref 2.0–4.0)
Glucose: 103 mg/dL — ABNORMAL HIGH (ref 71–99)
Osmolality Calculated: 287 mOsm/kg (ref 275–300)
Potassium: 4 mMol/L (ref 3.5–5.3)
Protein, Total: 5.4 gm/dL — ABNORMAL LOW (ref 6.0–8.3)
Sodium: 142 mMol/L (ref 136–147)

## 2020-12-07 LAB — D-DIMER, QUANTITATIVE: D-Dimer: 0.2 mg/L FEU (ref 0.19–0.52)

## 2020-12-07 LAB — VH URINALYSIS WITH MICROSCOPIC AND CULTURE IF INDICATED
Bilirubin, UA: NEGATIVE mg/dL
Blood, UA: NEGATIVE mg/dL
Glucose, UA: NEGATIVE mg/dL
Ketones UA: NEGATIVE
Leukocyte Esterase, UA: NEGATIVE Leu/uL
Nitrite, UA: NEGATIVE
Protein, UR: NEGATIVE mg/dL
Urine Specific Gravity: 1.015 (ref 1.005–1.030)
Urobilinogen, UA: 0.2 mg/dL
pH, Urine: 5.5 pH (ref 5.0–8.0)

## 2020-12-07 LAB — LACTATE DEHYDROGENASE: LDH: 179 U/L (ref 94–250)

## 2020-12-07 LAB — FERRITIN: Ferritin: 47 ng/mL (ref 21.8–274.6)

## 2020-12-07 LAB — C-REACTIVE PROTEIN: C-Reactive Protein: 6.48 mg/dL — ABNORMAL HIGH (ref 0.02–0.80)

## 2020-12-07 LAB — VH DEXTROSE STICK GLUCOSE: Glucose POCT: 106 mg/dL — ABNORMAL HIGH (ref 71–99)

## 2020-12-07 NOTE — Plan of Care (Signed)
NURSE NOTE SUMMARY  [facility] PULMONARY RENAL UNIT   Patient Name: [name]   Attending Physician: Esmond Harps, DO   Today's date:   12/07/2020 LOS: 0 days   Shift Summary:                                                              Patient resting in bed for majority of shift, ambulating ad lib to bathroom. Patient's wife at bedside throughout the night. Patient noted to have low BPs in the evening (10pt difference between left and right arms), though patient reports no dizziness or other symptoms; orthostatic vitals noted. Patient medicated per [facility] for intermittent head, maintaining O2 sat high 90s on room air at rest. No other acute distress observed.     Provider Notifications:   Nocturnist, BP 70-80s - give additional 1 liter bolus     Rapid Response Notifications:  Mobility:      PMP Activity: Step 6 - Walks in Room (12/06/2020  7:30 PM)     Weight tracking:  Family Dynamic:   Last 3 Weights for the past 72 hrs (Last 3 readings):   Weight   12/06/20 1154 69.4 kg (153 lb)             Last Bowel Movement   Last BM Date: 12/06/20       Problem: Contact & Droplet Isolation  Goal: Prevent transmission of other diagnosed infection while caring for patient in Droplet-Contact isolation  Outcome: Progressing     Problem: Moderate/High Fall Risk Score >5  Goal: Patient will remain free of falls  Outcome: Progressing     Problem: Insufficient Fluid Volume  Goal: Exhibits normovolemia  Outcome: Progressing

## 2020-12-07 NOTE — Plan of Care (Signed)
NURSE NOTE SUMMARY  [facility] PULMON[facility] RENAL UNIT   Patient Name: Joseph Haynes   Attending Physician: Esmond Harps, DO   Today's date:   12/07/2020 LOS: 0 days   Shift Summary:                                                              Assumed care of pt @ 0700. Pt resting; no complaints at this time; VSS; pt is on room air; call bell within reach; will continue to monitor.    1100: discharge instructions given and reviewed with pt and family; pt and family verbalize understanding and have no further questions;   Provider Notifications:        Rapid Response Notifications:  Mobility:      PMP Activity: Step 6 - Walks in Room (12/07/2020 10:00 AM)     Weight tracking:  Family Dynamic:   Last 3 Weights for the past 72 hrs (Last 3 readings):   Weight   12/06/20 1154 69.4 kg (153 lb)             Last Bowel Movement   Last BM Date: 12/06/20       Problem: Contact & Droplet Isolation  Goal: Prevent transmission of other diagnosed infection while caring for patient in Droplet-Contact isolation  12/07/2020 1046 by Ardelia Mems, RN  Outcome: Progressing  Flowsheets (Taken 12/07/2020 1046)  Is appropriate isolation sign and PPE outside Patient Room?: Yes  Provided Patient and family education: Verbal education  Strict Hand Hygiene: Yes  Donning and doffing PPE properly both Staff and Visitors: Yes  Multi-dose Medications stored in plastic bags?: Yes  12/07/2020 1046 by Ardelia Mems, RN  Outcome: Progressing     Problem: Insufficient Fluid Volume  Goal: Exhibits normovolemia  12/07/2020 1046 by Ardelia Mems, RN  Outcome: Progressing  Flowsheets (Taken 12/07/2020 1046)  Exhibits normovolemia:   Monitor for signs and symptoms of hypovolemia: tachycardia, tachypnea, decreased urine output, postural hypotension, confusion, syncope   Monitor weight   Assess and reassess fluid and electrolyte status  12/07/2020 1046 by Ardelia Mems, RN  Outcome: Progressing

## 2020-12-07 NOTE — Discharge Summary (Signed)
Medicine Discharge Summary   [facility]   Patient Name: Joseph Haynes   Attending Physician: Esmond Harps, DO PCP: Leonie Man, [place]   Date of Admission: 12/06/2020 D/C Date: 12/07/20   Discharge Diagnoses:     Active Hospital Problems    Diagnosis    COVID-19 virus infection         Hospital Course       Joseph Haynes is a 70 y.o. male patient that was admitted on 12/06/2020 for a syncopal event work-up.     Joseph Haynes presents to the emergency department from home by EMS after syncopal event.  He reports feeling well this morning.  He took his Lantus but did not eat or drink.  He went for a walk.  Toward the end, he felt dizzy.  He came home and had episodes of nausea and vomiting.  Following this he had a syncopal event witnessed by his wife.  Currently, he is feeling better.  He is thirsty.  He denies any fevers or chills.  No cough or cold symptoms.  No chest pain or shortness of breath.  No diarrhea.  Denies abdominal pain.  No swelling of the extremities.  Currently, no other complaints.  He was not aware that he had COVID infection.  Denies known exposures.  He has been vaccinated for COVID.    The patient was admitted to medical floor with continuous pulse oximetry.  Was not hypoxic.  Was monitored closely for respiratory status.  Strict droplet and contact isolation precautions were ordered.  Avoiding use of nebulizers and NSAIDs.  Vital signs are in flared inflammatory markers monitored.  Patient was monitored on telemetry.  Patient had signs or symptoms of dehydration and was given IV fluids.  Patient's condition markedly improved.  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  Patient remains afebrile afebrile and felt back to baseline.  Patient discharged to home and instructed to follow-up with PCP in 1 week.    Syncope (resolved)  Likely due to volume  depletion and possibly autonomic insufficiency due to Parkinson's      Dehydration (resolved)    Diabetes mellitis, type 2  Continue home medications     N/V (resolved)    Continue home medications      Hypertension  Continue home medications     Parkinson's disease  Continue home medications        Pending Results and other significant studies:  None     Discharge Instructions:          Disposition: Home  Diet: Regular Diet  Activity: As tolerated  Discharge Code Status: Full Code    No follow-up provider specified.     Discharge Medications:                                                                        Discharge Medication List        Taking      aspirin 81 MG EC tablet  Dose: 81 mg  Take 81 mg by mouth every morning     B-12 2500 MCG Tabs  Dose:  1 tablet  Take 1 tablet by mouth daily     carbidopa-levodopa 25-100 MG per tablet  Dose: 2 tablet  Commonly known as: SINEMET  Take 2 tablets by mouth 4 (four) times daily     carboxymethylcellulose (PF) 0.5 % ophthalmic solution  Dose: 1 drop  Commonly known as: REFRESH PLUS  Place 1 drop into both eyes 3 (three) times daily as needed (dry eyes)     CENTRUM ADULTS PO  Dose: 1 tablet  Take 1 tablet by mouth every morning     Co Q-10 100 MG Caps  Dose: 1 capsule  Take 1 capsule by mouth every morning     D3 5000 125 MCG (5000 UT) capsule  Dose: 5,000 Units  Generic drug: vitamin D3  Take 5,000 Units by mouth daily     Fish Oil 1360 MG Caps  Dose: 1 capsule  Take 1 capsule by mouth every morning     Levemir FlexTouch 100 UNIT/ML injection pen  Dose: 72 Units  Generic drug: insulin detemir  Inject 72 Units into the skin every morning     lisinopril 40 MG tablet  Dose: 40 mg  Commonly known as: ZESTRIL  Take 40 mg by mouth daily     * metFORMIN 1000 MG tablet  Dose: 1,000 mg  Commonly known as: GLUCOPHAGE  Take 1,000 mg by mouth 2 (two) times daily with meals Take one tablet two times daily with meals and then 1/2 tablet (500 mg) at lunch     * metFORMIN 1000 MG  tablet  Dose: 500 mg  Commonly known as: GLUCOPHAGE  Take 500 mg by mouth daily with lunch Take one tablet two times daily with meals and then 1/2 tablet (500 mg) at lunch     Pramipexole Dihydrochloride 0.75 MG Tb24  Dose: 1 tablet  Take 1 tablet by mouth every morning     rosuvastatin 20 MG tablet  Dose: 20 mg  Commonly known as: CRESTOR  Take 20 mg by mouth every evening           * This list has 2 medication(s) that are the same as other medications prescribed for you. Read the directions carefully, and ask your doctor or other care provider to review them with you.                   Discharge Day Exam (12/07/2020):     Blood pressure 150/82, pulse 84, temperature 97.7 F (36.5 C), temperature source Oral, resp. rate 20, weight 69.4 kg (153 lb), SpO2 98 %.      General: Patient is awake. In no acute distress.  Chest: CTA bilaterally. No rhonchi, no wheezing. No use of accessory muscles.  CVS: Normal rate and regular rhythm no murmurs  Abdomen: Soft, non-tender  Extremities: No pitting edema, pulses palpable, no calf swelling and no gross deformity.  Skin: Warm, dry  NEURO: resting tremor     Recent Labs      Recent Labs   Lab 12/07/20  0419 12/06/20  1201   WBC 14.5* 13.0*   RBC 3.70* 5.24   Hemoglobin 12.3* 17.5   Hematocrit 37.4* 52.9*   MCV 101* 101*   PLT CT 129* 179     Recent Labs   Lab 12/06/20  1201   PT 10.8   PT INR 1.0     Recent Labs   Lab 12/06/20  1654 12/06/20  1201   Troponin I <0.01  --  Creatine Kinase (CK) 97 128     No results found for: HGBA1CPERCNT  Recent Labs   Lab 12/07/20  0419 12/06/20  1231   Glucose 103*  --    Sodium 142  --    Potassium 4.0  --    Chloride 110  --    CO2 23  --    BUN 24*  --    Creatinine 0.90  --    Creatinine I-Stat  --  1.40*   EGFR 93 55*   Calcium 8.1*  --      Recent Labs   Lab 12/07/20  0419 12/06/20  1201   Albumin 3.0* 4.7   Protein, Total 5.4* 8.1   Bilirubin, Total 0.6 0.9   Alkaline Phosphatase 44 82   ALT 7 34   AST (SGOT) 20 29        Allergies:       Patient has no known allergies.   Time spent on discharging the patient:  35 minutes   CT Head WO- (Rad read)    Result Date: 12/06/2020  No acute intracranial abnormality. MRI is more sensitive for acute ischemia. ReadingStation:[email or web address]    XR Chest AP Portable    Result Date: 12/06/2020  No acute abnormality. ReadingStation:[id number]    Home Health Needs:  There are no questions and answers to display.      Anson [place], MD         12/07/20 9:13 AM   MRN: [id number]                                      CSN: [id number] DOB: January 05, 1951

## 2021-01-04 ENCOUNTER — Telehealth: Payer: Self-pay

## 2021-01-04 NOTE — Telephone Encounter (Signed)
Referral and note received and scanned to 9.19.22 Scan Only

## 2021-01-04 NOTE — Telephone Encounter (Signed)
01/04/21 LMFPTCB TO SCHED ECHO; 1ST ATTEMPT; ORDERS IN FOLDER - JR

## 2021-01-05 ENCOUNTER — Encounter: Payer: Self-pay | Admitting: Family Medicine

## 2021-01-05 DIAGNOSIS — R011 Cardiac murmur, unspecified: Secondary | ICD-10-CM

## 2021-01-06 NOTE — Progress Notes (Signed)
No previous Echo or Stress Echo

## 2021-01-12 ENCOUNTER — Ambulatory Visit: Payer: Medicare Other

## 2021-01-12 DIAGNOSIS — R011 Cardiac murmur, unspecified: Secondary | ICD-10-CM

## 2022-01-10 ENCOUNTER — Telehealth: Payer: Self-pay

## 2022-01-10 NOTE — Telephone Encounter (Signed)
REFERRAL AND NOTE RECEIVED AND SCANNED TO 09.25.23 SCAN ONLY

## 2022-01-17 ENCOUNTER — Encounter: Payer: Self-pay | Admitting: Family Medicine

## 2022-01-17 ENCOUNTER — Telehealth: Payer: Self-pay

## 2022-01-17 DIAGNOSIS — R0989 Other specified symptoms and signs involving the circulatory and respiratory systems: Secondary | ICD-10-CM

## 2022-01-17 NOTE — Telephone Encounter (Signed)
01/17/22 LMFPTCB TO SCHED CAROTID; 1ST ATTEMPT - JR

## 2022-03-03 ENCOUNTER — Other Ambulatory Visit: Payer: Medicare Other
